# Patient Record
Sex: Female | Born: 2000 | Race: White | Hispanic: No | Marital: Single | Smoking: Current every day smoker
Health system: Southern US, Community
[De-identification: ages and names within clinical notes are randomized; demographics above are authoritative.]

## PROBLEM LIST (undated history)

## (undated) DIAGNOSIS — F419 Anxiety disorder, unspecified: Secondary | ICD-10-CM

## (undated) DIAGNOSIS — F319 Bipolar disorder, unspecified: Secondary | ICD-10-CM

## (undated) DIAGNOSIS — J45909 Unspecified asthma, uncomplicated: Secondary | ICD-10-CM

## (undated) DIAGNOSIS — F913 Oppositional defiant disorder: Secondary | ICD-10-CM

## (undated) HISTORY — PX: TONSILLECTOMY: SUR1361

---
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
## 2019-01-03 ENCOUNTER — Emergency Department (HOSPITAL_COMMUNITY)
Admission: EM | Admit: 2019-01-03 | Discharge: 2019-01-03 | Disposition: A | Discharge status: Home / Self Care | Payer: Medicaid Other | Attending: Pediatrics | Admitting: Pediatrics

## 2019-01-03 ENCOUNTER — Encounter (HOSPITAL_COMMUNITY): Payer: Self-pay | Admitting: *Deleted

## 2019-01-03 DIAGNOSIS — J45909 Unspecified asthma, uncomplicated: Secondary | ICD-10-CM | POA: Diagnosis not present

## 2019-01-03 DIAGNOSIS — F17200 Nicotine dependence, unspecified, uncomplicated: Secondary | ICD-10-CM | POA: Diagnosis not present

## 2019-01-03 DIAGNOSIS — Z79899 Other long term (current) drug therapy: Secondary | ICD-10-CM | POA: Insufficient documentation

## 2019-01-03 DIAGNOSIS — F10220 Alcohol dependence with intoxication, uncomplicated: Secondary | ICD-10-CM | POA: Diagnosis present

## 2019-01-03 DIAGNOSIS — F10920 Alcohol use, unspecified with intoxication, uncomplicated: Secondary | ICD-10-CM

## 2019-01-03 LAB — CBC WITH DIFFERENTIAL/PLATELET
Abs Immature Granulocytes: 0.01 10*3/uL (ref 0.00–0.07)
BASOS PCT: 0 %
Basophils Absolute: 0 10*3/uL (ref 0.0–0.1)
EOS PCT: 0 %
Eosinophils Absolute: 0 10*3/uL (ref 0.0–0.5)
HCT: 39.9 % (ref 36.0–46.0)
HEMOGLOBIN: 13 g/dL (ref 12.0–15.0)
Immature Granulocytes: 0 %
Lymphocytes Relative: 29 %
Lymphs Abs: 1.7 10*3/uL (ref 0.7–4.0)
MCH: 26.4 pg (ref 26.0–34.0)
MCHC: 32.6 g/dL (ref 30.0–36.0)
MCV: 80.9 fL (ref 80.0–100.0)
MONO ABS: 0.4 10*3/uL (ref 0.1–1.0)
MONOS PCT: 7 %
Neutro Abs: 3.8 10*3/uL (ref 1.7–7.7)
Neutrophils Relative %: 64 %
PLATELETS: 293 10*3/uL (ref 150–400)
RBC: 4.93 MIL/uL (ref 3.87–5.11)
RDW: 16.5 % — ABNORMAL HIGH (ref 11.5–15.5)
WBC: 6 10*3/uL (ref 4.0–10.5)
nRBC: 0 % (ref 0.0–0.2)

## 2019-01-03 LAB — RAPID URINE DRUG SCREEN, HOSP PERFORMED
AMPHETAMINES: NOT DETECTED
Barbiturates: NOT DETECTED
Benzodiazepines: NOT DETECTED
COCAINE: NOT DETECTED
OPIATES: NOT DETECTED
TETRAHYDROCANNABINOL: NOT DETECTED

## 2019-01-03 LAB — COMPREHENSIVE METABOLIC PANEL
ALK PHOS: 61 U/L (ref 38–126)
ALT: 17 U/L (ref 0–44)
ANION GAP: 16 — AB (ref 5–15)
AST: 28 U/L (ref 15–41)
Albumin: 4.7 g/dL (ref 3.5–5.0)
BUN: 10 mg/dL (ref 6–20)
CALCIUM: 9.2 mg/dL (ref 8.9–10.3)
CO2: 16 mmol/L — AB (ref 22–32)
CREATININE: 0.63 mg/dL (ref 0.44–1.00)
Chloride: 108 mmol/L (ref 98–111)
Glucose, Bld: 71 mg/dL (ref 70–99)
Potassium: 3.5 mmol/L (ref 3.5–5.1)
SODIUM: 140 mmol/L (ref 135–145)
TOTAL PROTEIN: 7.9 g/dL (ref 6.5–8.1)
Total Bilirubin: <0.1 mg/dL — ABNORMAL LOW (ref 0.3–1.2)

## 2019-01-03 LAB — PREGNANCY, URINE: PREG TEST UR: NEGATIVE

## 2019-01-03 LAB — ACETAMINOPHEN LEVEL: Acetaminophen (Tylenol), Serum: <10 ug/mL — ABNORMAL LOW (ref 10–30)

## 2019-01-03 LAB — SALICYLATE LEVEL: Salicylate Lvl: <7 mg/dL (ref 2.8–30.0)

## 2019-01-03 LAB — ETHANOL: ALCOHOL ETHYL (B): 153 mg/dL — AB (ref <10)

## 2019-01-03 MED ORDER — SODIUM CHLORIDE 0.9 % IV BOLUS
1000.0000 mL | Freq: Once | INTRAVENOUS | Status: AC
  Administered 2019-01-03: 1000 mL via INTRAVENOUS

## 2019-01-03 MED ORDER — ONDANSETRON HCL 4 MG/2ML IJ SOLN
4.0000 mg | Freq: Once | INTRAMUSCULAR | Status: AC
  Administered 2019-01-03: 4 mg via INTRAVENOUS
  Filled 2019-01-03: qty 2

## 2019-01-03 MED ORDER — DEXTROSE-NACL 5-0.9 % IV SOLN
INTRAVENOUS | Status: DC
  Administered 2019-01-03: 10:00:00 via INTRAVENOUS

## 2019-01-03 NOTE — ED Notes (Signed)
ED Provider at bedside.  Kelly H notified of B/P for second time, into arouse patient, B/P improved, and orders received.

## 2019-01-03 NOTE — ED Notes (Signed)
Pt ambulated approx 100 feet without difficulty. RN at side when ambulating. Pt returned to bed and given water and gold fish crackers. Mom remains at bedside.

## 2019-01-03 NOTE — ED Notes (Signed)
Registration in room. Pt able to be aroused but sleepy, answering questions in yes or no manner.

## 2019-01-03 NOTE — ED Triage Notes (Signed)
Pt brought in by [facility]. Per EMS pt told parents she took "1 or 2 too many" of her pills. EMS has Cipro and Benzonatate at bedside. Pt sts she had "a whole lot" of shots prior to coming home. Emesis en route. Pt sleepy, easily woken, answering questions appropriately.

## 2019-01-03 NOTE — ED Provider Notes (Signed)
[facility] EMERGENCY DEPARTMENT Provider Note   CSN: [id number] Arrival date & time: 01/03/19  0522     History   Chief Complaint Chief Complaint  Patient presents with  . Alcohol Intoxication    HPI Chelsea Andrade is a 18 y.o. female.  18 year old female with history of bipolar disorder, ODD, asthma presents to the emergency department via EMS for intoxication.  She reports drinking an undisclosed amount of liquor tonight.  Had one episode of vomiting prior to EMS arrival as well as one episode of emesis with EMS.  Father called EMS after patient reported that she had taken an additional dose of her Tessalon and ciprofloxacin.  These were prescribed to the patient over a month ago.  She has no complaints of pain at this time.  Does note some mild nausea.  No additional complaints.  The history is provided by the patient. No language interpreter was used.    Past Medical History:  Diagnosis Date  . Anxiety   . Asthma   . Bipolar disorder (HCC)   . ODD (oppositional defiant disorder)     Patient Active Problem List   Diagnosis Date Noted  . Generalized anxiety disorder 06/01/2015  . Attention deficit hyperactivity disorder (ADHD), combined type, moderate 06/01/2015  . 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 Drug overdose 05/30/2015  . Ingestion of unknown medication 05/30/2015  . Drug ingestion 05/30/2015  . ASTHMA 07/31/2011  . CONTUSION OF FOOT 07/31/2011    Past Surgical History:  Procedure Laterality Date  . TONSILLECTOMY       OB History   No obstetric history on file.      Home Medications    Prior to Admission medications   Medication Sig Start Date End Date Taking? Authorizing Provider  estrogens, conjugated, (PREMARIN) 1.25 MG tablet Take 1 tablet (1.25 mg total) by mouth daily. 01/27/18   Vena Austria, MD  ibuprofen (ADVIL,MOTRIN) 600 MG tablet Take 1 tablet (600 mg total)  by mouth every 6 (six) hours as needed for moderate pain. 08/09/18   [name], PA-C  methylphenidate 36 MG PO CR tablet Take 1 tablet (36 mg total) by mouth daily. 06/09/15   Patrick North, MD  mirtazapine (REMERON) 7.5 MG tablet Take 1 tablet (7.5 mg total) by mouth at bedtime. 06/09/15   Patrick North, MD    Family History Family History  Problem Relation Age of Onset  . Anxiety disorder Mother   . Bipolar disorder Father   . Depression Maternal Grandmother   . Anxiety disorder Maternal Grandmother   . Multiple sclerosis Maternal Grandmother     Social History Social History   Tobacco Use  . Smoking status: Current Every Day Smoker  . Smokeless tobacco: Never Used  Substance Use Topics  . Alcohol use: Yes  . Drug use: Yes    Types: Marijuana     Allergies   Patient has no known allergies.   Review of Systems Review of Systems Ten systems reviewed and are negative for acute change, except as noted in the HPI.    Physical Exam Updated Vital Signs BP (!) 76/42   Pulse 83   Temp 97.7 F (36.5 C)   Resp 16   LMP  (Approximate)   SpO2 100%   Physical Exam Vitals signs and nursing note reviewed.  Constitutional:      General: She is not in acute distress.    Appearance: She is well-developed. She is  not diaphoretic.     Comments: Nontoxic appearing and in NAD  HENT:     Head: Normocephalic and atraumatic.  Eyes:     General: No scleral icterus.    Conjunctiva/sclera: Conjunctivae normal.  Neck:     Musculoskeletal: Normal range of motion.  Cardiovascular:     Rate and Rhythm: Regular rhythm. Tachycardia present.  Pulmonary:     Effort: Pulmonary effort is normal. No respiratory distress.     Breath sounds: No stridor. No wheezing or rales.     Comments: Lungs CTAB Musculoskeletal: Normal range of motion.  Skin:    General: Skin is warm and dry.     Coloration: Skin is not pale.     Findings: No erythema or rash.     Comments: Abrasion over  left ASIS  Neurological:     Mental Status: She is alert and oriented to person, place, and time.     Comments: GCS 15.  Speech is goal oriented.  Answers questions appropriately and follows commands.  Moving all extremities spontaneously.  Will fall back to sleep when not stimulated.  Psychiatric:        Behavior: Behavior normal.      ED Treatments / Results  Labs (all labs ordered are listed, but only abnormal results are displayed) Labs Reviewed - No data to display  EKG None  Radiology No results found.  Procedures Procedures (including critical care time)  Medications Ordered in ED Medications  sodium chloride 0.9 % bolus 1,000 mL (has no administration in time range)  ondansetron (ZOFRAN) injection 4 mg (4 mg Intravenous Given 01/03/19 0600)  sodium chloride 0.9 % bolus 1,000 mL (0 mLs Intravenous Stopped 01/03/19 0703)    6:51 AM Notified by RN of patient's low blood pressure.  She is more sleepy compared to arrival, but able to be aroused with sternal rubbing.  Her blood pressure improved when more awake, but easily falls back to sleep when not stimulated.  We will continue to metabolize and order second fluid bolus.   Initial Impression / Assessment and Plan / ED Course  I have reviewed the triage vital signs and the nursing notes.  Pertinent labs & imaging results that were available during my care of the patient were reviewed by me and considered in my medical decision making (see chart for details).      Patient presents to the emergency department for alcohol intoxication.  Reports having multiple liquor drinks prior to arrival.  Father became concerned when patient notified him that she accidentally took additional doses of her Tessalon and ciprofloxacin.  These prescriptions were filled in mid December.  Noted to be alert and answering questions appropriately on arrival.  She will be given 2 L IV fluids while monitored in the department to metabolize  alcohol.  Patient remains alert to sternal rubbing.  Hypotension noted while sleeping, though this improves when more awake.  No compensatory tachycardia.  Anticipate discharge when more awake and alert.    Patient signed out to Lowanda Foster, NP at change of shift who will assume care and disposition appropriately.   Final Clinical Impressions(s) / ED Diagnoses   Final diagnoses:  Alcoholic intoxication without complication [facility])    ED Discharge Orders    None       [name], PA-C 01/03/19 0710    Nira Conn, MD 01/03/19 [phone]
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
## 2019-05-29 ENCOUNTER — Other Ambulatory Visit: Payer: Self-pay

## 2019-05-29 DIAGNOSIS — N760 Acute vaginitis: Secondary | ICD-10-CM | POA: Diagnosis not present

## 2019-05-29 DIAGNOSIS — F17200 Nicotine dependence, unspecified, uncomplicated: Secondary | ICD-10-CM | POA: Insufficient documentation

## 2019-05-29 DIAGNOSIS — N3000 Acute cystitis without hematuria: Secondary | ICD-10-CM | POA: Diagnosis not present

## 2019-05-29 DIAGNOSIS — N898 Other specified noninflammatory disorders of vagina: Secondary | ICD-10-CM | POA: Diagnosis present

## 2019-05-29 DIAGNOSIS — J45909 Unspecified asthma, uncomplicated: Secondary | ICD-10-CM | POA: Diagnosis not present

## 2019-05-29 DIAGNOSIS — B9689 Other specified bacterial agents as the cause of diseases classified elsewhere: Secondary | ICD-10-CM | POA: Insufficient documentation

## 2019-05-29 DIAGNOSIS — N766 Ulceration of vulva: Secondary | ICD-10-CM | POA: Diagnosis not present

## 2019-05-29 DIAGNOSIS — Z79899 Other long term (current) drug therapy: Secondary | ICD-10-CM | POA: Diagnosis not present

## 2019-05-29 NOTE — ED Triage Notes (Signed)
Pt states burning to inner vaginal lips with possible "bumps". Pt states began yesterday. Pt does not appear in any acute distress. Pt denies vaginal discharge.

## 2019-05-30 ENCOUNTER — Emergency Department
Admission: EM | Admit: 2019-05-30 | Discharge: 2019-05-30 | Disposition: A | Discharge status: Home / Self Care | Payer: Medicaid Other | Attending: Emergency Medicine | Admitting: Emergency Medicine

## 2019-05-30 DIAGNOSIS — B9689 Other specified bacterial agents as the cause of diseases classified elsewhere: Secondary | ICD-10-CM

## 2019-05-30 DIAGNOSIS — N766 Ulceration of vulva: Secondary | ICD-10-CM

## 2019-05-30 DIAGNOSIS — N3000 Acute cystitis without hematuria: Secondary | ICD-10-CM

## 2019-05-30 LAB — URINALYSIS, ROUTINE W REFLEX MICROSCOPIC
Bilirubin Urine: NEGATIVE
Glucose, UA: NEGATIVE mg/dL
Hgb urine dipstick: NEGATIVE
Ketones, ur: NEGATIVE mg/dL
Nitrite: NEGATIVE
Protein, ur: 30 mg/dL — AB
Specific Gravity, Urine: 1.029 (ref 1.005–1.030)
WBC, UA: >50 WBC/hpf — ABNORMAL HIGH (ref 0–5)
pH: 6 (ref 5.0–8.0)

## 2019-05-30 LAB — CHLAMYDIA/NGC RT PCR (ARMC ONLY)
Chlamydia Tr: NOT DETECTED
N gonorrhoeae: NOT DETECTED

## 2019-05-30 LAB — WET PREP, GENITAL
Sperm: NONE SEEN
Trich, Wet Prep: NONE SEEN
Yeast Wet Prep HPF POC: NONE SEEN

## 2019-05-30 LAB — POCT PREGNANCY, URINE: Preg Test, Ur: NEGATIVE

## 2019-05-30 MED ORDER — METRONIDAZOLE 500 MG PO TABS
500.0000 mg | ORAL_TABLET | Freq: Once | ORAL | Status: AC
  Administered 2019-05-30: 500 mg via ORAL
  Filled 2019-05-30: qty 1

## 2019-05-30 MED ORDER — VALACYCLOVIR HCL 500 MG PO TABS
1000.0000 mg | ORAL_TABLET | ORAL | Status: AC
  Administered 2019-05-30: 1000 mg via ORAL
  Filled 2019-05-30: qty 2

## 2019-05-30 MED ORDER — METRONIDAZOLE 500 MG PO TABS: 500.0000 mg | ORAL_TABLET | Freq: Two times a day (BID) | ORAL | 0 refills | Status: DC

## 2019-05-30 MED ORDER — LIDOCAINE-PRILOCAINE 2.5-2.5 % EX CREA
TOPICAL_CREAM | CUTANEOUS | Status: AC
  Administered 2019-05-30: 04:00:00 via TOPICAL
  Filled 2019-05-30: qty 5

## 2019-05-30 MED ORDER — CEPHALEXIN 500 MG PO CAPS
500.0000 mg | ORAL_CAPSULE | Freq: Once | ORAL | Status: AC
  Administered 2019-05-30: 500 mg via ORAL
  Filled 2019-05-30: qty 1

## 2019-05-30 MED ORDER — ONDANSETRON 4 MG PO TBDP
4.0000 mg | ORAL_TABLET | Freq: Once | ORAL | Status: AC
  Administered 2019-05-30: 4 mg via ORAL
  Filled 2019-05-30: qty 1

## 2019-05-30 MED ORDER — CEPHALEXIN 500 MG PO CAPS: 500.0000 mg | ORAL_CAPSULE | Freq: Two times a day (BID) | ORAL | 0 refills | Status: DC

## 2019-05-30 MED ORDER — VALACYCLOVIR HCL 1 G PO TABS: 1000.0000 mg | ORAL_TABLET | Freq: Two times a day (BID) | ORAL | 0 refills | Status: AC

## 2019-05-30 MED ORDER — OXYCODONE-ACETAMINOPHEN 5-325 MG PO TABS
2.0000 | ORAL_TABLET | Freq: Once | ORAL | Status: AC
  Administered 2019-05-30: 2 via ORAL
  Filled 2019-05-30: qty 2

## 2019-05-30 NOTE — Discharge Instructions (Signed)
As we discussed, you have a urinary tract infection for which I have prescribed an antibiotic called cephalexin (Keflex).  Please take the full course of treatment provided.  Additionally you have an infection that is not sexually transmitted and is very common for women, called bacterial vaginosis.  For this you need to complete the course of metronidazole (Flagyl) as prescribed.  Additionally, we recommend that you take the prescribed valacyclovir (Valtrex) until you can follow-up with Dr. Georgianne Fick on Monday to discuss the ulcers and lesions that are causing so much pain.  I also wrote a prescription for nausea medicine in case all of the other medications caused your stomach to be upset.  Please try to drink plenty of fluids and you may want to have some food on your stomach when you take the medications.  Call the office of Dr. Georgianne Fick on Monday to schedule a same-day appointment if at all possible.  Return to the emergency department if you develop new or worsening symptoms that concern you.

## 2019-05-30 NOTE — ED Provider Notes (Signed)
[facility] Emergency Department Provider Note  ____________________________________________   First MD Initiated Contact with Patient 05/30/19 0124     (approximate)  I have reviewed the triage vital signs and the nursing notes.   HISTORY  Chief Complaint vaginal burning    HPI Chelsea Andrade is a 18 y.o. female with medical history as listed below who presents for evaluation of painful lesions in her genital area and burning with urination.  She said that they have developed over the last couple of days and she can feel some bumps that are painful to the touch but not exquisitely painful.  She has burning pain whenever she urinates.  She has been with the same sexual partner for the last 3 years and that is the only sexual partner she has had.  She denies fever/chills, sore throat, cough, chest pain, shortness of breath, nausea, vomiting, and abdominal pain or pelvic pain.  All the pain is when she urinates and in her vulvar area.  She has no history of UTIs nor STDs.         Past Medical History:  Diagnosis Date   Anxiety    Asthma    Bipolar disorder ([place])    ODD (oppositional defiant disorder)     Patient Active Problem List   Diagnosis Date Noted   Generalized anxiety disorder 06/01/2015   Attention deficit hyperactivity disorder (ADHD), combined type, moderate 06/01/2015   MDD (major depressive disorder), recurrent severe, without psychosis ([place]) 05/31/2015   Drug overdose 05/30/2015   Ingestion of unknown medication 05/30/2015   Drug ingestion 05/30/2015   ASTHMA 07/31/2011   CONTUSION OF FOOT 07/31/2011    Past Surgical History:  Procedure Laterality Date   TONSILLECTOMY      Prior to Admission medications   Medication Sig Start Date End Date Taking? Authorizing Provider  cephALEXin (KEFLEX) 500 MG capsule Take 1 capsule (500 mg total) by mouth 2 (two) times daily. 05/30/19   Hinda Kehr, MD  estrogens,  conjugated, (PREMARIN) 1.25 MG tablet Take 1 tablet (1.25 mg total) by mouth daily. 01/27/18   Malachy Mood, MD  ibuprofen (ADVIL,MOTRIN) 600 MG tablet Take 1 tablet (600 mg total) by mouth every 6 (six) hours as needed for moderate pain. 08/09/18   Duanne Guess, PA-C  methylphenidate 36 MG PO CR tablet Take 1 tablet (36 mg total) by mouth daily. 06/09/15   Elvin So, MD  metroNIDAZOLE (FLAGYL) 500 MG tablet Take 1 tablet (500 mg total) by mouth 2 (two) times daily. 05/30/19   Hinda Kehr, MD  mirtazapine (REMERON) 7.5 MG tablet Take 1 tablet (7.5 mg total) by mouth at bedtime. 06/09/15   Elvin So, MD  valACYclovir (VALTREX) 1000 MG tablet Take 1 tablet (1,000 mg total) by mouth 2 (two) times daily for 10 days. 05/30/19 06/09/19  Hinda Kehr, MD    Allergies Patient has no known allergies.  Family History  Problem Relation Age of Onset   Anxiety disorder Mother    Bipolar disorder Father    Depression Maternal Grandmother    Anxiety disorder Maternal Grandmother    Multiple sclerosis Maternal Grandmother     Social History Social History   Tobacco Use   Smoking status: Current Every Day Smoker   Smokeless tobacco: Never Used  Substance Use Topics   Alcohol use: Yes   Drug use: Yes    Types: Marijuana    Review of Systems Constitutional: No fever/chills Eyes: No visual changes. ENT: No sore throat.  Cardiovascular: Denies chest pain. Respiratory: Denies shortness of breath. Gastrointestinal: No abdominal pain.  No nausea, no vomiting.  No diarrhea.  No constipation. Genitourinary: Negative for dysuria. Musculoskeletal: Negative for neck pain.  Negative for back pain. Integumentary: Negative for rash. Neurological: Negative for headaches, focal weakness or numbness.   ____________________________________________   PHYSICAL EXAM:  VITAL SIGNS: ED Triage Vitals  Enc Vitals Group     BP 05/29/19 2139 122/75     Pulse Rate 05/29/19 2139 (!)  110     Resp 05/29/19 2139 18     Temp 05/29/19 2139 99.1 F (37.3 C)     Temp Source 05/29/19 2139 Oral     SpO2 05/29/19 2139 100 %     Weight 05/29/19 2140 56.7 kg (125 lb)     Height 05/29/19 2140 1.676 m (5\' 6" )     Head Circumference --      Peak Flow --      Pain Score 05/29/19 2140 8     Pain Loc --      Pain Edu? --      Excl. in GC? --     Constitutional: Alert and oriented. Well appearing and in no acute distress but appears uncomfortable when she moves. Eyes: Conjunctivae are normal.  Head: Atraumatic. Respiratory: Normal respiratory effort.  No retractions. No audible wheezing. Gastrointestinal: Soft and nontender. No distention.  Genitourinary: The patient has numerous roughly circular white raised lesions all throughout her vulva, some of which look vesicular but do not appear in clusters like the classic appearance of HSV-2.  She has excoriation of the skin with skin breakdown particularly just outside of the labia majora near the inferior part of the labia.  The lesions are very tender to the touch.  She has a copious amount of whitish discharge present in the vaginal vault.  IUD strings are visible.  Cervix is normal in appearance without evidence of cervicitis or friable tissue.  Due to the external lesions, she was unable to tolerate bimanual exam.  Speculum exam was very painful for her.  ED chaperone present throughout exam. Musculoskeletal: No lower extremity tenderness nor edema. No gross deformities of extremities. Neurologic:  Normal speech and language. No gross focal neurologic deficits are appreciated.  Skin:  Skin is warm, dry and intact. No rash noted. Psychiatric: Mood and affect are normal. Speech and behavior are normal.  ____________________________________________   LABS (all labs ordered are listed, but only abnormal results are displayed)  Labs Reviewed  WET PREP, GENITAL - Abnormal; Notable for the following components:      Result Value    Clue Cells Wet Prep HPF POC PRESENT (*)    WBC, Wet Prep HPF POC MANY (*)    All other components within normal limits  URINALYSIS, ROUTINE W REFLEX MICROSCOPIC - Abnormal; Notable for the following components:   Color, Urine YELLOW (*)    APPearance HAZY (*)    Protein, ur 30 (*)    Leukocytes,Ua LARGE (*)    WBC, UA >50 (*)    Bacteria, UA RARE (*)    All other components within normal limits  CHLAMYDIA/NGC RT PCR ([facility] ONLY)  URINE CULTURE  POCT PREGNANCY, URINE  POC URINE PREG, ED   ____________________________________________  EKG  None - EKG not ordered by ED physician ____________________________________________  RADIOLOGY   ED MD interpretation: No indication for imaging  Official radiology report(s): No results found.  ____________________________________________   PROCEDURES   Procedure(s) performed (including Critical  Care):  Procedures   ____________________________________________   INITIAL IMPRESSION / MDM / ASSESSMENT AND PLAN / ED COURSE  As part of my medical decision making, I reviewed the following data within the electronic MEDICAL RECORD NUMBER Nursing notes reviewed and incorporated, Labs reviewed , Discussed with Dr. Bonney AidStaebler by phone, and reviewed Notes from prior ED visits      *Raylei Defino was evaluated in Emergency Department on 05/30/2019 for the symptoms described in the history of present illness. She was evaluated in the context of the global COVID-19 pandemic, which necessitated consideration that the patient might be at risk for infection with the SARS-CoV-2 virus that causes COVID-19. Institutional protocols and algorithms that pertain to the evaluation of patients at risk for COVID-19 are in a state of rapid change based on information released by regulatory bodies including the CDC and federal and state organizations. These policies and algorithms were followed during the patient's care in the ED.  Some ED evaluations  and interventions may be delayed as a result of limited staffing during the pandemic.*  Differential diagnosis includes, but is not limited to, genital herpes, aphthous ulcers, BV, UTI, PID/TOA, trichomoniasis, yeast infection.  The patient initially had some tachycardic upon arrival but that is resolved and she has no systemic issues at all including no abdominal nor pelvic pain.  The issues seem to be primarily external.  Her urine is strongly positive and I have sent a culture and will treat empirically with Keflex.  I have sent off a wet prep as well as GC chlamydia.  I am most suspicious for genital herpes but the appearance is not completely consistent and could represent aphthous ulcers.  I called and spoke by phone with Dr. Bonney AidStaebler who is her OB/GYN.  He recommended the use of EMLA cream for pain control, treating empirically for primary herpes outbreak with Valtrex, and he will see her in clinic within a couple of days.  I have dated the patient.  She prefers to stay in the ED for the results of the gonorrhea and chlamydia test which I think is understandable and appropriate.  There is no evidence that she has PID given no abdominal pain or pelvic pain but if she is positive for gonorrhea or chlamydia I may treat with doxycycline for 2 weeks rather than a single dose of azithromycin.  I will reassess once the results are back.  Clinical Course as of May 30 527  Sat May 30, 2019  0337 BV, but no trichomonas nor yeast  Wet prep, genital(!) [CF]  [phone] Still awaiting chlamydia/gonorrhea results.  I have treated thus far with Keflex 500 mg by mouth, metronidazole 500 mg by mouth, EMLA cream, Valtrex 1 g p.o., Zofran 4 mg p.o. ODT anticipating some stomach discomfort with all the other medications.   [CF]  0520 The patient's GC chlamydia test came back negative.  We will proceed with discharge and she will follow-up with Dr. Bonney AidStaebler on Monday.  I reiterated my management recommendations and return  precautions and she understands and agrees with the plan.   [CF]    Clinical Course User Index [CF] Loleta RoseForbach, Caisley Baxendale, MD     ____________________________________________  FINAL CLINICAL IMPRESSION(S) / ED DIAGNOSES  Final diagnoses:  Ulcer of genital labia  Acute cystitis without hematuria  Bacterial vaginosis     MEDICATIONS GIVEN DURING THIS VISIT:  Medications  oxyCODONE-acetaminophen (PERCOCET/ROXICET) 5-325 MG per tablet 2 tablet (2 tablets Oral Given 05/30/19 0157)  lidocaine-prilocaine (EMLA) cream ( Topical  Given 05/30/19 0343)  cephALEXin (KEFLEX) capsule 500 mg (500 mg Oral Given 05/30/19 0344)  valACYclovir (VALTREX) tablet 1,000 mg (1,000 mg Oral Given 05/30/19 0343)  metroNIDAZOLE (FLAGYL) tablet 500 mg (500 mg Oral Given 05/30/19 0343)  ondansetron (ZOFRAN-ODT) disintegrating tablet 4 mg (4 mg Oral Given 05/30/19 0343)     ED Discharge Orders         Ordered    cephALEXin (KEFLEX) 500 MG capsule  2 times daily     05/30/19 0525    metroNIDAZOLE (FLAGYL) 500 MG tablet  2 times daily     05/30/19 0525    valACYclovir (VALTREX) 1000 MG tablet  2 times daily     05/30/19 [id number]           Note:  This document was prepared using Dragon voice recognition software and may include unintentional dictation errors.   Loleta RoseForbach, Merisa Julio, MD 05/30/19 [phone]

## 2019-05-30 NOTE — ED Notes (Signed)
ED Provider at bedside. 

## 2019-05-31 LAB — URINE CULTURE
Culture: <10000 — AB
Special Requests: NORMAL

## 2019-06-03 ENCOUNTER — Other Ambulatory Visit: Payer: Self-pay

## 2019-06-03 ENCOUNTER — Encounter: Payer: Self-pay | Admitting: Obstetrics & Gynecology

## 2019-06-03 ENCOUNTER — Ambulatory Visit (INDEPENDENT_AMBULATORY_CARE_PROVIDER_SITE_OTHER): Payer: Medicaid Other | Admitting: Obstetrics & Gynecology

## 2019-06-03 VITALS — BP 88/60 | Ht 65.0 in | Wt 124.0 lb

## 2019-06-03 DIAGNOSIS — A6004 Herpesviral vulvovaginitis: Secondary | ICD-10-CM | POA: Diagnosis not present

## 2019-06-03 DIAGNOSIS — R102 Pelvic and perineal pain: Secondary | ICD-10-CM

## 2019-06-03 MED ORDER — LIDOCAINE-PRILOCAINE 2.5-2.5 % EX CREA: 1.0000 "application " | TOPICAL_CREAM | CUTANEOUS | 0 refills | Status: DC | PRN

## 2019-06-03 NOTE — Progress Notes (Signed)
HPI:      Chelsea Andrade is a 18 y.o. No obstetric history on file., No LMP recorded. (Menstrual status: IUD)., presents today for a problem visit.  She complains of:  Vulvar concern:   This is a 18 y.o. old Caucasian/White female who presents for the evaluation of vulvar lesion(s). She describes the vulvar lesion(s) as very painful, around vulva, have started to fade but still has the pain.  She indicates that she has noticed 5 or so lesions small and tender to touch.  She indicates she first noticed the problem one week ago. She admits to symptoms of pain.  The following aggravating factors are identified: sexual intercourse, physical activity, wearing pads, wearing tight clothing and urination. The following alleviating factors are identified: topical Lidocaine.  She was seen in ER and had UTI dx, also suspicion for HSV and started on Valtrex, no testing done; neg GC/Chl testing.  PMHx: She  has a past medical history of Anxiety, Asthma, Bipolar disorder (HCC), and ODD (oppositional defiant disorder). Also,  has a past surgical history that includes Tonsillectomy., family history includes Anxiety disorder in her maternal grandmother and mother; Bipolar disorder in her father; Depression in her maternal grandmother; Multiple sclerosis in her maternal grandmother.,  reports that she has been smoking. She has never used smokeless tobacco. She reports current alcohol use. She reports current drug use. Drug: Marijuana.  She has a current medication list which includes the following prescription(s): cephalexin, estrogens (conjugated), ibuprofen, metronidazole, valacyclovir, lidocaine-prilocaine, methylphenidate, and mirtazapine. Also, has No Known Allergies.  Review of Systems  Constitutional: Negative for chills, fever and malaise/fatigue.  HENT: Negative for congestion, sinus pain and sore throat.   Eyes: Negative for blurred vision and pain.  Respiratory: Negative for cough and wheezing.    Cardiovascular: Negative for chest pain and leg swelling.  Gastrointestinal: Negative for abdominal pain, constipation, diarrhea, heartburn, nausea and vomiting.  Genitourinary: Negative for dysuria, frequency, hematuria and urgency.  Musculoskeletal: Negative for back pain, joint pain, myalgias and neck pain.  Skin: Negative for itching and rash.  Neurological: Negative for dizziness, tremors and weakness.  Endo/Heme/Allergies: Does not bruise/bleed easily.  Psychiatric/Behavioral: Negative for depression. The patient is not nervous/anxious and does not have insomnia.     Objective: BP (!) 88/60   Ht 5\' 5"  (1.651 m)   Wt 124 lb (56.2 kg)   BMI 20.63 kg/m  Physical Exam Constitutional:      General: She is not in acute distress.    Appearance: She is well-developed.  Genitourinary:     Pelvic exam was performed with patient supine.     Urethra and bladder normal.     Vulval lesion and tenderness present.     Vaginal bleeding present.     No vaginal erythema.     No cervical motion tenderness, discharge, polyp or nabothian cyst.     Genitourinary Comments: Unable to do speculum exam due to pt pain and refusal  HENT:     Head: Normocephalic and atraumatic.     Nose: Nose normal.  Abdominal:     General: There is no distension.     Palpations: Abdomen is soft.     Tenderness: There is no abdominal tenderness.  Musculoskeletal: Normal range of motion.  Neurological:     Mental Status: She is alert and oriented to person, place, and time.     Cranial Nerves: No cranial nerve deficit.  Skin:    General: Skin is warm and dry.  ASSESSMENT/PLAN:    Problem List Items Addressed This Visit    Herpes simplex vulvovaginitis    -  Primary   Relevant Orders   HSV(herpes smplx)abs-1+2(IgG+IgM)-bld   HEP, RPR, HIV Panel   Vulvar pain       Emla cream to help w pain Cont Valtrex until dx confirmed Discussed suppressive tx option Source and concerns w partner discussed            Barnett Applebaum, MD, [facility], [facility] 06/03/2019  2:20 PM

## 2019-06-05 LAB — HEP, RPR, HIV PANEL
HIV Screen 4th Generation wRfx: NONREACTIVE
Hepatitis B Surface Ag: NEGATIVE
RPR Ser Ql: NONREACTIVE

## 2019-06-05 LAB — HSV(HERPES SMPLX)ABS-I+II(IGG+IGM)-BLD
HSV 1 Glycoprotein G Ab, IgG: 2.02 index — ABNORMAL HIGH (ref 0.00–0.90)
HSV 2 IgG, Type Spec: <0.91 index (ref 0.00–0.90)
HSVI/II Comb IgM: 2.45 Ratio — ABNORMAL HIGH (ref 0.00–0.90)

## 2019-06-08 ENCOUNTER — Encounter: Payer: Self-pay | Admitting: Obstetrics & Gynecology

## 2019-06-08 NOTE — Progress Notes (Signed)
Phone number does not work. Letter created/sent to have pt call back for results.

## 2019-06-10 ENCOUNTER — Telehealth: Payer: Self-pay

## 2019-06-10 ENCOUNTER — Other Ambulatory Visit: Payer: Self-pay | Admitting: Obstetrics and Gynecology

## 2019-06-10 MED ORDER — LIDOCAINE-PRILOCAINE 2.5-2.5 % EX CREA: 1.0000 "application " | TOPICAL_CREAM | CUTANEOUS | 0 refills | Status: DC | PRN

## 2019-06-10 NOTE — Progress Notes (Signed)
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 Had neg HSV 2 IgG. Lesion Sx are improving. Finishing valtrex, needs RF on emla crm since helping with pain. Current partner with hx of oral HSV. No need for suppressive tx since HSV 1 in genital area. F/u prn.

## 2019-06-10 NOTE — Telephone Encounter (Signed)
Done. See orders note 

## 2019-06-10 NOTE — Telephone Encounter (Signed)
Pt calling for results of blood work; also to discuss medication rx PH was going to give her.  [phone]

## 2019-07-16 ENCOUNTER — Telehealth: Payer: Self-pay | Admitting: Obstetrics and Gynecology

## 2019-07-16 NOTE — Telephone Encounter (Signed)
Patient called and left message with after hour nurse line for an appointment to schedule for IUD. I called and left message for patient to call back to be schedule

## 2019-08-04 ENCOUNTER — Other Ambulatory Visit: Payer: Self-pay

## 2019-08-04 DIAGNOSIS — Z20822 Contact with and (suspected) exposure to covid-19: Secondary | ICD-10-CM

## 2019-08-05 LAB — NOVEL CORONAVIRUS, NAA: SARS-CoV-2, NAA: NOT DETECTED

## 2019-08-06 ENCOUNTER — Ambulatory Visit (INDEPENDENT_AMBULATORY_CARE_PROVIDER_SITE_OTHER): Payer: Medicaid Other | Admitting: Obstetrics and Gynecology

## 2019-08-06 ENCOUNTER — Encounter: Payer: Self-pay | Admitting: Obstetrics and Gynecology

## 2019-08-06 ENCOUNTER — Other Ambulatory Visit: Payer: Self-pay

## 2019-08-06 VITALS — BP 104/58 | Ht 65.0 in | Wt 121.0 lb

## 2019-08-06 DIAGNOSIS — Z3009 Encounter for other general counseling and advice on contraception: Secondary | ICD-10-CM

## 2019-08-06 DIAGNOSIS — Z30432 Encounter for removal of intrauterine contraceptive device: Secondary | ICD-10-CM

## 2019-08-06 NOTE — Progress Notes (Signed)
   GYNECOLOGY OFFICE PROCEDURE NOTE  Chelsea Andrade is a 18 y.o. No obstetric history on file. here for [facility] IUD removal placed 2018. She desires removal secondary to irregular bleeding..  IUD Removal  Patient identified, informed consent performed, consent signed.  Patient was in the dorsal lithotomy position, normal external genitalia was noted.  A speculum was placed in the patient's vagina, normal discharge was noted, no lesions. The cervix was visualized, no lesions, no abnormal discharge.  The strings of the IUD were grasped and pulled using ring forceps. The IUD was removed in its entirety. Patient tolerated the procedure well.    Patient will use barrier contraception for next few months.  May be interested in OCP eventually.  Routine preventative health maintenance measures emphasized.   Malachy Mood, MD, [facility], [place]

## 2019-09-11 ENCOUNTER — Ambulatory Visit: Payer: Medicaid Other | Admitting: Obstetrics & Gynecology

## 2019-09-14 LAB — FETAL NONSTRESS TEST

## 2019-09-21 ENCOUNTER — Ambulatory Visit: Payer: Medicaid Other | Admitting: Obstetrics and Gynecology

## 2019-09-25 ENCOUNTER — Ambulatory Visit: Payer: Medicaid Other | Admitting: Obstetrics and Gynecology

## 2019-11-24 ENCOUNTER — Ambulatory Visit: Payer: Medicaid Other | Admitting: Obstetrics and Gynecology

## 2019-12-18 NOTE — L&D Delivery Note (Signed)
Delivery Note At  0002, a viable female was delivered vaginally, presenting OA, with restitution to ROT. Easy delivery of the anterior shoulder, followed by the posterior shoulder and torso. Infant was placed immediately skin to skin on maternal abdomen. Spontaneous respirations noted..  APGAR:8 9, ; weight  pending.   Placenta status:  intact at 0007,  .  Cord:  3 vessel. The cord was clamped and cut only once no longer pulsing.  .  Anesthesia:  Epidural Episiotomy:  none Lacerations:  bilateral labial lacerations and small 1st degree perineal Suture Repair: 3.0 vicryl rapide Est. Blood Loss (mL):  250 cc  Mom to postpartum.  Baby to Couplet care / Skin to Skin.  Mirna Mires 10/06/2020, 12:49 AM

## 2019-12-31 ENCOUNTER — Emergency Department
Admission: EM | Admit: 2019-12-31 | Discharge: 2019-12-31 | Disposition: A | Discharge status: Home / Self Care | Payer: Medicaid Other | Attending: Emergency Medicine | Admitting: Emergency Medicine

## 2019-12-31 ENCOUNTER — Other Ambulatory Visit: Payer: Self-pay

## 2019-12-31 DIAGNOSIS — M545 Low back pain: Secondary | ICD-10-CM | POA: Diagnosis present

## 2019-12-31 DIAGNOSIS — Z5321 Procedure and treatment not carried out due to patient leaving prior to being seen by health care provider: Secondary | ICD-10-CM | POA: Insufficient documentation

## 2019-12-31 LAB — POCT PREGNANCY, URINE: Preg Test, Ur: NEGATIVE

## 2019-12-31 LAB — CBC
HCT: 37.8 % (ref 36.0–46.0)
Hemoglobin: 12.9 g/dL (ref 12.0–15.0)
MCH: 29.3 pg (ref 26.0–34.0)
MCHC: 34.1 g/dL (ref 30.0–36.0)
MCV: 85.9 fL (ref 80.0–100.0)
Platelets: 254 10*3/uL (ref 150–400)
RBC: 4.4 MIL/uL (ref 3.87–5.11)
RDW: 11.7 % (ref 11.5–15.5)
WBC: 6.5 10*3/uL (ref 4.0–10.5)
nRBC: 0 % (ref 0.0–0.2)

## 2019-12-31 LAB — URINALYSIS, COMPLETE (UACMP) WITH MICROSCOPIC
Bacteria, UA: NONE SEEN
Bilirubin Urine: NEGATIVE
Glucose, UA: NEGATIVE mg/dL
Hgb urine dipstick: NEGATIVE
Ketones, ur: NEGATIVE mg/dL
Leukocytes,Ua: NEGATIVE
Nitrite: NEGATIVE
Protein, ur: NEGATIVE mg/dL
Specific Gravity, Urine: 1.002 — ABNORMAL LOW (ref 1.005–1.030)
WBC, UA: NONE SEEN WBC/hpf (ref 0–5)
pH: 6 (ref 5.0–8.0)

## 2019-12-31 LAB — BASIC METABOLIC PANEL
Anion gap: 10 (ref 5–15)
BUN: 12 mg/dL (ref 6–20)
CO2: 21 mmol/L — ABNORMAL LOW (ref 22–32)
Calcium: 9.5 mg/dL (ref 8.9–10.3)
Chloride: 110 mmol/L (ref 98–111)
Creatinine, Ser: 0.61 mg/dL (ref 0.44–1.00)
GFR calc Af Amer: >60 mL/min (ref >60)
GFR calc non Af Amer: >60 mL/min (ref >60)
Glucose, Bld: 94 mg/dL (ref 70–99)
Potassium: 3.7 mmol/L (ref 3.5–5.1)
Sodium: 141 mmol/L (ref 135–145)

## 2019-12-31 NOTE — ED Triage Notes (Signed)
Pt presents via POV c/o lower back pain x1 week. Pt presents because she has taken oxycodone x2 and ES Tylenol and is now drowsy. A&Ox4, answering questions appropriately.

## 2019-12-31 NOTE — ED Notes (Signed)
Patient called, no answer. Not seen in lobby, bathroom, or outside 

## 2019-12-31 NOTE — ED Notes (Signed)
Patient called, no answer.

## 2020-03-09 ENCOUNTER — Other Ambulatory Visit: Payer: Self-pay

## 2020-03-09 ENCOUNTER — Ambulatory Visit (INDEPENDENT_AMBULATORY_CARE_PROVIDER_SITE_OTHER): Payer: Medicaid Other | Admitting: Obstetrics and Gynecology

## 2020-03-09 ENCOUNTER — Other Ambulatory Visit (HOSPITAL_COMMUNITY)
Admission: RE | Admit: 2020-03-09 | Discharge: 2020-03-09 | Disposition: A | Discharge status: Home / Self Care | Payer: Medicaid Other | Source: Ambulatory Visit | Attending: Obstetrics and Gynecology | Admitting: Obstetrics and Gynecology

## 2020-03-09 ENCOUNTER — Encounter: Payer: Self-pay | Admitting: Obstetrics and Gynecology

## 2020-03-09 VITALS — BP 114/70 | Wt 120.0 lb

## 2020-03-09 DIAGNOSIS — B009 Herpesviral infection, unspecified: Secondary | ICD-10-CM | POA: Insufficient documentation

## 2020-03-09 DIAGNOSIS — Z113 Encounter for screening for infections with a predominantly sexual mode of transmission: Secondary | ICD-10-CM | POA: Insufficient documentation

## 2020-03-09 DIAGNOSIS — O98519 Other viral diseases complicating pregnancy, unspecified trimester: Secondary | ICD-10-CM

## 2020-03-09 DIAGNOSIS — Z3400 Encounter for supervision of normal first pregnancy, unspecified trimester: Secondary | ICD-10-CM | POA: Diagnosis present

## 2020-03-09 DIAGNOSIS — Z3A01 Less than 8 weeks gestation of pregnancy: Secondary | ICD-10-CM | POA: Diagnosis not present

## 2020-03-09 DIAGNOSIS — Z3689 Encounter for other specified antenatal screening: Secondary | ICD-10-CM

## 2020-03-09 NOTE — Progress Notes (Signed)
hsv   New Obstetric Patient H&P    Chief Complaint: "Desires prenatal care"   History of Present Illness: Patient is a 19 y.o. G1P0 Not Hispanic or Latino female, presents with amenorrhea and positive home pregnancy test. Patient's last menstrual period was 01/17/2020. and based on her  LMP, her EDD is Estimated Date of Delivery: 10/23/20 and her EGA is [redacted]w[redacted]d. Cycles are irregular   Her last menstrual period was normal. Since her LMP she claims she has experienced some fatigue, breast tenderness. She denies vaginal bleeding. Her past medical history is noncontributory.   Since her LMP, she admits to the use of tobacco products  no There are cats in the home in the home  No She admits close contact with children on a regular basis  no  She has had chicken pox in the past yes She has had Tuberculosis exposures, symptoms, or previously tested positive for TB   no Current or past history of domestic violence. no  Genetic Screening/Teratology Counseling: (Includes patient, baby's father, or anyone in either family with:)   1. Patient's age >/= 22 at [facility]  no 2. Thalassemia ([place], [place], Mediterranean, or Asian background): MCV<80  no 3. Neural tube defect (meningomyelocele, spina bifida, anencephaly)  no 4. Congenital heart defect  no  5. Down syndrome  no 6. Tay-Sachs (Jewish, [place]  no 7. Canavan's Disease  no 8. Sickle cell disease or trait (African)  no  9. Hemophilia or other blood disorders  no  10. Muscular dystrophy  no  11. Cystic fibrosis  no  12. Huntington's Chorea  no  13. Mental retardation/autism  no 14. Other inherited genetic or chromosomal disorder  no 15. Maternal metabolic disorder (DM, PKU, etc)  no 16. Patient or FOB with a child with a birth defect not listed above no  16a. Patient or FOB with a birth defect themselves no 17. Recurrent pregnancy loss, or stillbirth  no  18. Any medications since LMP other than prenatal vitamins (include vitamins,  supplements, OTC meds, drugs, alcohol)  no 19. Any other genetic/environmental exposure to discuss  no  Infection History:   1. Lives with someone with TB or TB exposed  no  2. Patient or partner has history of genital herpes  yes 3. Rash or viral illness since LMP  no 4. History of STI (GC, CT, HPV, syphilis, HIV)  no 5. History of recent travel :  no  Other pertinent information:  yes Unsure medical history on FOB not involved    Review of Systems:10 point review of systems negative unless otherwise noted in HPI  Past Medical History:  Patient Active Problem List   Diagnosis Date Noted  . Generalized anxiety disorder 06/01/2015  . Attention deficit hyperactivity disorder (ADHD), combined type, moderate 06/01/2015  . 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 Drug overdose 05/30/2015  . Ingestion of unknown medication 05/30/2015  . Drug ingestion 05/30/2015  . ASTHMA 07/31/2011    Qualifier: Diagnosis of  By: Thurmond Butts MD, Dennard Nip     . CONTUSION OF FOOT 07/31/2011    Qualifier: Diagnosis of  By: Thurmond Butts MD, Dennard Nip       Past Surgical History:  Past Surgical History:  Procedure Laterality Date  . TONSILLECTOMY      Gynecologic History: Patient's last menstrual period was 01/17/2020.  Obstetric History: G1P0  Family History:  Family History  Problem Relation Age of Onset  . Anxiety disorder Mother   . Bipolar  disorder Father   . Depression Maternal Grandmother   . Anxiety disorder Maternal Grandmother   . Multiple sclerosis Maternal Grandmother     Social History:  Social History   Socioeconomic History  . Marital status: Single    Spouse name: Not on file  . Number of children: Not on file  . Years of education: Not on file  . Highest education level: Not on file  Occupational History  . Not on file  Tobacco Use  . Smoking status: Current Every Day Smoker  . Smokeless tobacco: Never Used  Substance and  Sexual Activity  . Alcohol use: Not Currently  . Drug use: Not Currently    Types: Marijuana  . Sexual activity: Yes    Birth control/protection: None    Comment: pt denies being sexually active  Other Topics Concern  . Not on file  Social History Narrative   Lives with Maternal Gma Chelsea Andrade and Mother Chelsea Andrade. Step Dad 2 siblings. Cats, dogs, horses. Home schooled. Uncle died in [place] last week. Hx of bipolar, depression, anxiety. Dad not involved.   Social Determinants of Health   Financial Resource Strain:   . Difficulty of Paying Living Expenses:   Food Insecurity:   . Worried About Charity fundraiser in the Last Year:   . Arboriculturist in the Last Year:   Transportation Needs:   . Film/video editor (Medical):   Marland Kitchen Lack of Transportation (Non-Medical):   Physical Activity:   . Days of Exercise per Week:   . Minutes of Exercise per Session:   Stress:   . Feeling of Stress :   Social Connections:   . Frequency of Communication with Friends and Family:   . Frequency of Social Gatherings with Friends and Family:   . Attends Religious Services:   . Active Member of Clubs or Organizations:   . Attends Archivist Meetings:   Marland Kitchen Marital Status:   Intimate Partner Violence:   . Fear of Current or Ex-Partner:   . Emotionally Abused:   Marland Kitchen Physically Abused:   . Sexually Abused:     Allergies:  No Known Allergies  Medications: Prior to Admission medications   Not on File    Physical Exam Vitals: Blood pressure 114/70, weight 120 lb (54.4 kg), last menstrual period 01/17/2020.  General: NAD HEENT: normocephalic, anicteric Thyroid: no enlargement, no palpable nodules Pulmonary: No increased work of breathing, CTAB Cardiovascular: RRR, distal pulses 2+ Abdomen: NABS, soft, non-tender, non-distended.  Umbilicus without lesions.  No hepatomegaly, splenomegaly or masses palpable. No evidence of hernia  Genitourinary:  External: Normal external  female genitalia.  Normal urethral meatus, normal  Bartholin's and Skene's glands.    Vagina: Normal vaginal mucosa, no evidence of prolapse.    Cervix: Grossly normal in appearance, no bleeding  Uterus:  Non-enlarged, mobile, normal contour.  No CMT  Adnexa: ovaries non-enlarged, no adnexal masses  Rectal: deferred Extremities: no edema, erythema, or tenderness Neurologic: Grossly intact Psychiatric: mood appropriate, affect full   Assessment: 19 y.o. G1P0 at [redacted]w[redacted]d presenting to initiate prenatal care  Plan: 1) Avoid alcoholic beverages. 2) Patient encouraged not to smoke.  3) Discontinue the use of all non-medicinal drugs and chemicals.  4) Take prenatal vitamins daily.  5) Nutrition, food safety (fish, cheese advisories, and high nitrite foods) and exercise discussed. 6) Hospital and practice style discussed with cross coverage system.  7) Genetic Screening, such as with 1st Trimester Screening, cell free fetal  DNA, AFP testing, and Ultrasound, as well as with amniocentesis and CVS as appropriate, is discussed with patient. At the conclusion of today's visit patient requested genetic testing 8) Dating ultrasound given unsure LMP   Vena Austria, MD, [facility], [facility] 03/09/2020, 1:52 PM

## 2020-03-09 NOTE — Progress Notes (Signed)
NOB today LMP 01/17/2020

## 2020-03-10 LAB — RPR+RH+ABO+RUB AB+AB SCR+CB...
Antibody Screen: NEGATIVE
HIV Screen 4th Generation wRfx: NONREACTIVE
Hematocrit: 39 % (ref 34.0–46.6)
Hemoglobin: 13.5 g/dL (ref 11.1–15.9)
Hepatitis B Surface Ag: NEGATIVE
MCH: 31.1 pg (ref 26.6–33.0)
MCHC: 34.6 g/dL (ref 31.5–35.7)
MCV: 90 fL (ref 79–97)
Platelets: 273 10*3/uL (ref 150–450)
RBC: 4.34 x10E6/uL (ref 3.77–5.28)
RDW: 12.2 % (ref 11.7–15.4)
RPR Ser Ql: NONREACTIVE
Rh Factor: POSITIVE
Rubella Antibodies, IGG: 10.5 index (ref >0.99)
Varicella zoster IgG: 885 index (ref >165)
WBC: 6.2 10*3/uL (ref 3.4–10.8)

## 2020-03-11 ENCOUNTER — Other Ambulatory Visit: Payer: Self-pay | Admitting: Obstetrics and Gynecology

## 2020-03-11 LAB — CERVICOVAGINAL ANCILLARY ONLY
Chlamydia: NEGATIVE
Comment: NEGATIVE
Comment: NEGATIVE
Comment: NORMAL
Neisseria Gonorrhea: NEGATIVE
Trichomonas: NEGATIVE

## 2020-03-11 LAB — URINE CULTURE: Organism ID, Bacteria: NO GROWTH

## 2020-03-11 MED ORDER — VALACYCLOVIR HCL 500 MG PO TABS: 500.0000 mg | ORAL_TABLET | Freq: Two times a day (BID) | ORAL | 6 refills | Status: AC

## 2020-03-15 ENCOUNTER — Encounter: Payer: Self-pay | Admitting: Advanced Practice Midwife

## 2020-03-15 ENCOUNTER — Other Ambulatory Visit: Payer: Self-pay | Admitting: Obstetrics and Gynecology

## 2020-03-15 ENCOUNTER — Ambulatory Visit (INDEPENDENT_AMBULATORY_CARE_PROVIDER_SITE_OTHER): Payer: Medicaid Other

## 2020-03-15 ENCOUNTER — Other Ambulatory Visit: Payer: Self-pay

## 2020-03-15 ENCOUNTER — Ambulatory Visit (INDEPENDENT_AMBULATORY_CARE_PROVIDER_SITE_OTHER): Payer: Medicaid Other | Admitting: Advanced Practice Midwife

## 2020-03-15 VITALS — Wt 122.0 lb

## 2020-03-15 DIAGNOSIS — Z3400 Encounter for supervision of normal first pregnancy, unspecified trimester: Secondary | ICD-10-CM

## 2020-03-15 DIAGNOSIS — Z3401 Encounter for supervision of normal first pregnancy, first trimester: Secondary | ICD-10-CM

## 2020-03-15 DIAGNOSIS — Z3689 Encounter for other specified antenatal screening: Secondary | ICD-10-CM | POA: Diagnosis not present

## 2020-03-15 DIAGNOSIS — Z3A01 Less than 8 weeks gestation of pregnancy: Secondary | ICD-10-CM | POA: Diagnosis not present

## 2020-03-15 DIAGNOSIS — Z3A08 8 weeks gestation of pregnancy: Secondary | ICD-10-CM

## 2020-03-15 LAB — POCT URINALYSIS DIPSTICK OB: Glucose, UA: NEGATIVE

## 2020-03-15 NOTE — Progress Notes (Signed)
  Routine Prenatal Care Visit  Subjective  Chelsea Andrade is a 19 y.o. G1P0 at [redacted]w[redacted]d being seen today for ongoing prenatal care.  She is currently monitored for the following issues for this high-risk pregnancy and has ASTHMA; CONTUSION OF FOOT; Drug overdose; Ingestion of unknown medication; Drug ingestion; MDD (major depressive disorder), recurrent severe, without psychosis (HCC); Generalized anxiety disorder; Attention deficit hyperactivity disorder (ADHD), combined type, moderate; Herpes simplex type 2 (HSV-2) infection affecting pregnancy, antepartum; and Supervision of normal first pregnancy, antepartum on their problem list.  ----------------------------------------------------------------------------------- Patient reports doing well. She is figuring out family issues associated with the pregnancy and reports mood is ok- no different than usual. Discussed possible pregnancy mood issues and reinforced need to let us know of worsening.  We discussed results of dating scan.  . Vag. Bleeding: None.   . Leaking Fluid denies.  ----------------------------------------------------------------------------------- The following portions of the patient's history were reviewed and updated as appropriate: allergies, current medications, past family history, past medical history, past social history, past surgical history and problem list. Problem list updated.  Objective  Weight 122 lb (55.3 kg), last menstrual period 01/17/2020. Pregravid weight 120 lb (54.4 kg) Total Weight Gain 2 lb (0.907 kg) Urinalysis: Urine Protein    Urine Glucose    Fetal Status: Fetal Heart Rate (bpm): 152          Dating scan: [redacted]w[redacted]d with EDD of 11/11, no adjustment of EDD for 4 day difference  General:  Alert, oriented and cooperative. Patient is in no acute distress.  Skin: Skin is warm and dry. No rash noted.   Cardiovascular: Normal heart rate noted  Respiratory: Normal respiratory effort, no problems with respiration  noted  Abdomen: Soft, gravid, appropriate for gestational age.       Pelvic:  Cervical exam deferred        Extremities: Normal range of motion.     Mental Status: Normal mood and affect. Normal behavior. Normal judgment and thought content.   Assessment   19 y.o. G1P0 at [redacted]w[redacted]d by  10/23/2020, by Last Menstrual Period presenting for routine prenatal visit  Plan   pregnancy Problems (from 03/09/20 to present)    Problem Noted Resolved   Herpes simplex type 2 (HSV-2) infection affecting pregnancy, antepartum 03/09/2020 by Vena Austria, MD No   Supervision of normal first pregnancy, antepartum 03/09/2020 by Vena Austria, MD No       Preterm labor symptoms and general obstetric precautions including but not limited to vaginal bleeding, contractions, leaking of fluid and fetal movement were reviewed in detail with the patient.   Return in about 2 weeks (around 03/29/2020) for rob.  Tresea Mall, CNM 03/15/2020 4:12 PM

## 2020-03-15 NOTE — Progress Notes (Signed)
ROB Dating scan 

## 2020-03-24 ENCOUNTER — Telehealth: Payer: Self-pay

## 2020-03-24 NOTE — Telephone Encounter (Signed)
Patient reports her morning sickness has picked up in the past week to all day long. She's requesting a Dr. Note for being out of work the past 2 days and also for today and tomorrow. Cb#[phone]

## 2020-03-24 NOTE — Telephone Encounter (Signed)
Advised per Tresea Mall secure chat response that we can only provide letter that states patient is pregnancy and may have intermittent episodes of sickness that may cause absence from work. Patient reports she isn't having n/v every day, it's roughly qod but 3-6 times daily. She is agreeable to trying meds for nausea, but hesitant to take anything that will make her sleepy. Letter created/sent thru my chart.

## 2020-03-29 ENCOUNTER — Other Ambulatory Visit: Payer: Self-pay | Admitting: Obstetrics and Gynecology

## 2020-03-29 DIAGNOSIS — O210 Mild hyperemesis gravidarum: Secondary | ICD-10-CM

## 2020-03-29 MED ORDER — ONDANSETRON 4 MG PO TBDP: 4.0000 mg | ORAL_TABLET | Freq: Four times a day (QID) | ORAL | 3 refills | Status: DC | PRN

## 2020-03-29 MED ORDER — DOCUSATE SODIUM 100 MG PO CAPS: 100.0000 mg | ORAL_CAPSULE | Freq: Two times a day (BID) | ORAL | 2 refills | Status: DC | PRN

## 2020-03-29 MED ORDER — PROMETHAZINE HCL 25 MG PO TABS: 25.0000 mg | ORAL_TABLET | Freq: Four times a day (QID) | ORAL | 3 refills | Status: DC | PRN

## 2020-03-29 NOTE — Telephone Encounter (Signed)
Called patient. Rx for medications sent.

## 2020-03-31 ENCOUNTER — Encounter: Payer: Medicaid Other | Admitting: Obstetrics and Gynecology

## 2020-04-08 ENCOUNTER — Encounter: Payer: Medicaid Other | Admitting: Obstetrics & Gynecology

## 2020-05-24 ENCOUNTER — Ambulatory Visit (INDEPENDENT_AMBULATORY_CARE_PROVIDER_SITE_OTHER): Payer: Medicaid Other | Admitting: Obstetrics and Gynecology

## 2020-05-24 ENCOUNTER — Encounter: Payer: Self-pay | Admitting: Obstetrics and Gynecology

## 2020-05-24 ENCOUNTER — Other Ambulatory Visit: Payer: Self-pay

## 2020-05-24 VITALS — BP 106/70 | Ht 65.0 in | Wt 118.4 lb

## 2020-05-24 DIAGNOSIS — Z3A18 18 weeks gestation of pregnancy: Secondary | ICD-10-CM

## 2020-05-24 DIAGNOSIS — Z3400 Encounter for supervision of normal first pregnancy, unspecified trimester: Secondary | ICD-10-CM

## 2020-05-24 DIAGNOSIS — O210 Mild hyperemesis gravidarum: Secondary | ICD-10-CM

## 2020-05-24 DIAGNOSIS — Z1379 Encounter for other screening for genetic and chromosomal anomalies: Secondary | ICD-10-CM

## 2020-05-24 MED ORDER — ONDANSETRON 4 MG PO TBDP: 4.0000 mg | ORAL_TABLET | Freq: Four times a day (QID) | ORAL | 3 refills | Status: DC | PRN

## 2020-05-24 MED ORDER — PROMETHAZINE HCL 25 MG PO TABS: 25.0000 mg | ORAL_TABLET | Freq: Four times a day (QID) | ORAL | 3 refills | Status: DC | PRN

## 2020-05-24 NOTE — Progress Notes (Signed)
Routine Prenatal Care Visit  Subjective  Chelsea Andrade is a 19 y.o. G1P0 at 61w2dbeing seen today for ongoing prenatal care.  She is currently monitored for the following issues for this low-risk pregnancy and has ASTHMA; CONTUSION OF FOOT; Drug overdose; Ingestion of unknown medication; Drug ingestion; MDD (major depressive disorder), recurrent severe, without psychosis ([place]; Generalized anxiety disorder; Attention deficit hyperactivity disorder (ADHD), combined type, moderate; Herpes simplex type 2 (HSV-2) infection affecting pregnancy, antepartum; and Supervision of normal first pregnancy, antepartum on their problem list.  ----------------------------------------------------------------------------------- Patient reports that she has missed several obstetrical appointments because she was considering an abortion.  She reports that when she is at the abortion clinic she changed her mind and decided to keep the pregnancy.  She reports that she has not yet told her parents about the pregnancy and worries that they will be upset.  She reports that she is no longer with the father the baby.  She reports that her parents do not have a good opinion of the father the father the baby.  She reports that she has told her one friend who is here today with her at the visit about her pregnancy.  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  She has lost about 6 pounds.  She reports that she is food and smell aversions.  She is staying hydrated but sometimes eats and drinks very little in the day.  She is not taking anything to help her with nausea. She would like to do genetic screening she is waiting for the gender of the baby and would like the gender to be a surprise.  Contractions: Not present. Vag. Bleeding: None.   . Denies leaking of fluid.    ----------------------------------------------------------------------------------- The following portions of the patient's history were reviewed and updated as appropriate: allergies, current medications, past family history, past medical history, past social history, past surgical history and problem list. Problem list updated.   Objective  Blood pressure 106/70, height '5\' 5"'$  (1.651 m), weight 118 lb 6.4 oz (53.7 kg), last menstrual period 01/17/2020. Pregravid weight 120 lb (54.4 kg) Total Weight Gain -1 lb 9.6 oz (-0.726 kg) Urinalysis:      Fetal Status: Fetal Heart Rate (bpm): 150         General:  Alert, oriented and cooperative. Patient is in no acute distress.  Skin: Skin is warm and dry. No rash noted.   Cardiovascular: Normal heart rate noted  Respiratory: Normal respiratory effort, no problems with respiration noted  Abdomen: Soft, gravid, appropriate for gestational age. Pain/Pressure: Absent     Pelvic:  Cervical exam deferred        Extremities: Normal range of motion.  Edema: None  Mental Status: Normal mood and affect. Normal behavior. Normal judgment and thought content.     Assessment   19y.o. G1P0 at 19w2dy  10/23/2020, by Last Menstrual Period presenting for routine prenatal visit  Plan   pregnancy Problems (from 03/09/20 to present)    Problem Noted Resolved   Herpes simplex type 2 (HSV-2) infection affecting pregnancy, antepartum 03/09/2020 by StMalachy MoodMD No   Supervision of normal first pregnancy, antepartum 03/09/2020 by StMalachy MoodMD No   Overview Addendum 05/24/2020  4:24 PM by ScHomero FellersMDViolaDating By LMP c/w 7wCharmayne Sheer  u/s Blood type: A/Positive/-- (03/24 1434)   Genetic Screen  Antibody:Negative (03/24 1434)  Anatomic [place]  Rubella: 10.50 (03/24 1434)  Varicella: immune  GTT 28 wk:  RPR: Non Reactive (03/24 1434)   Rhogam  not needed HBsAg: Negative (03/24 1434)   Vaccines TDAP:                        Flu Shot: HIV: Non Reactive (03/24 1434)   Baby Food                                GBS:   Contraception  Pap: under 21  CBB     CS/VBAC    Support Person               Maternity 20 today. Discussed with the patient strategies for speaking to her parents about her pregnancy. Referral made to [facility] the pregnancy care manager.  Give the patient Rose's phone number. Patient will return in 2 weeks for a anatomy ultrasound. Patient was given prenatal vitamin samples. Discussed with the patient a plan for hyperemesis.  Provided her with medications for Phenergan and Zofran.  Discussed over-the-counter medications that would be available as well.  Discussed that reasons for hospital admission includes loss of weight when she has met the criteria for as well as dehydration or inability to keep down food or water.  Encouraged the patient to contact us or go to the hospital if she has any symptoms. Discussed Covid vaccination with the patient, provided patient with handouts.  Gestational age appropriate obstetric precautions including but not limited to vaginal bleeding, contractions, leaking of fluid and fetal movement were reviewed in detail with the patient.    Return in about 2 weeks (around 06/07/2020) for [place] and [place], anatomy in person.  Homero Fellers MD [facility], [facility] 05/24/2020, 5:14 PM

## 2020-05-24 NOTE — Patient Instructions (Addendum)
Rose- Care Manager [phone]  Initial steps to help :   B6 (pyridoxine) 25 mg,  3-4 times a day- 200 mg a day total Unisom (doxylamine) 25 mg at bedtime **B6 and Unisom are available as a combination prescription medications called diclegis and bonjesta  B1 (thiamin)  50-100 mg 1-2 a day-  100 mg a day total  Continue prenatal vitamin with iron and thiamin. If it is not tolerated switch to 1 mg of folic acid.  Can add medication for gastric reflux if needed.  Subsequent steps to be added to B1, B6, and Unisom:  1. Antihistamine (one of the following medications) Dramamine      25-50 mg every 4-6 hours Benadryl      25-50 mg every 4-6 hours Meclizine      25 mg every 6 hours  2. Dopamine Antagonist (one of the following medications) Metoclopramide  (Reglan)  5-10 mg every 6-8 hours         PO Promethazine   (Phenergan)   12.5-25 mg every 4-6 hours      PO or rectal Prochlorperazine  (Compazine)  5-10 mg every 6-8 hours     25mg  BID rectally    Subsequent steps if there has still not been improvement in symptoms:  3. Daily stool softner:  Colace 100 mg twice a day  4. Ondansetron  (Zofran)   4-8 mg every 6-8 hours     Hyperemesis Gravidarum Hyperemesis gravidarum is a severe form of nausea and vomiting that happens during pregnancy. Hyperemesis is worse than morning sickness. It may cause you to have nausea or vomiting all day for many days. It may keep you from eating and drinking enough food and liquids, which can lead to dehydration, malnutrition, and weight loss. Hyperemesis usually occurs during the first half (the first 20 weeks) of pregnancy. It often goes away once a woman is in her second half of pregnancy. However, sometimes hyperemesis continues through an entire pregnancy. What are the causes? The cause of this condition is not known. It may be related to changes in chemicals (hormones) in the body during pregnancy, such as the high level of pregnancy hormone  (human chorionic gonadotropin) or the increase in the female sex hormone (estrogen). What are the signs or symptoms? Symptoms of this condition include:  Nausea that does not go away.  Vomiting that does not allow you to keep any food down.  Weight loss.  Body fluid loss (dehydration).  Having no desire to eat, or not liking food that you have previously enjoyed. How is this diagnosed? This condition may be diagnosed based on:  A physical exam.  Your medical history.  Your symptoms.  Blood tests.  Urine tests. How is this treated? This condition is managed by controlling symptoms. This may include:  Following an eating plan. This can help lessen nausea and vomiting.  Taking prescription medicines. An eating plan and medicines are often used together to help control symptoms. If medicines do not help relieve nausea and vomiting, you may need to receive fluids through an IV at the hospital. Follow these instructions at home: Eating and drinking   Avoid the following: ? Drinking fluids with meals. Try not to drink anything during the 30 minutes before and after your meals. ? Drinking more than 1 cup of fluid at a time. ? Eating foods that trigger your symptoms. These may include spicy foods, coffee, high-fat foods, very sweet foods, and acidic foods. ? Skipping meals. Nausea  can be more intense on an empty stomach. If you cannot tolerate food, do not force it. Try sucking on ice chips or other frozen items and make up for missed calories later. ? Lying down within 2 hours after eating. ? Being exposed to environmental triggers. These may include food smells, smoky rooms, closed spaces, rooms with strong smells, warm or humid places, overly loud and noisy rooms, and rooms with motion or flickering lights. Try eating meals in a well-ventilated area that is free of strong smells. ? Quick and sudden changes in your movement. ? Taking iron pills and multivitamins that contain  iron. If you take prescription iron pills, do not stop taking them unless your health care provider approves. ? Preparing food. The smell of food can spoil your appetite or trigger nausea.  To help relieve your symptoms: ? Listen to your body. Everyone is different and has different preferences. Find what works best for you. ? Eat and drink slowly. ? Eat 5-6 small meals daily instead of 3 large meals. Eating small meals and snacks can help you avoid an empty stomach. ? In the morning, before getting out of bed, eat a couple of crackers to avoid moving around on an empty stomach. ? Try eating starchy foods as these are usually tolerated well. Examples include cereal, toast, bread, potatoes, pasta, rice, and pretzels. ? Include at least 1 serving of protein with your meals and snacks. Protein options include lean meats, poultry, seafood, beans, nuts, nut butters, eggs, cheese, and yogurt. ? Try eating a protein-rich snack before bed. Examples of a protein-rick snack include cheese and crackers or a peanut butter sandwich made with 1 slice of whole-wheat bread and 1 tsp (5 g) of peanut butter. ? Eat or suck on things that have ginger in them. It may help relieve nausea. Add  tsp ground ginger to hot tea or choose ginger tea. ? Try drinking 100% fruit juice or an electrolyte drink. An electrolyte drink contains sodium, potassium, and chloride. ? Drink fluids that are cold, clear, and carbonated or sour. Examples include lemonade, ginger ale, lemon-lime soda, ice water, and sparkling water. ? Brush your teeth or use a mouth rinse after meals. ? Talk with your health care provider about starting a supplement of vitamin B6. General instructions  Take over-the-counter and prescription medicines only as told by your health care provider.  Follow instructions from your health care provider about eating or drinking restrictions.  Continue to take your prenatal vitamins as told by your health care  provider. If you are having trouble taking your prenatal vitamins, talk with your health care provider about different options.  Keep all follow-up and pre-birth (prenatal) visits as told by your health care provider. This is important. Contact a health care provider if:  You have pain in your abdomen.  You have a severe headache.  You have vision problems.  You are losing weight.  You feel weak or dizzy. Get help right away if:  You cannot drink fluids without vomiting.  You vomit blood.  You have constant nausea and vomiting.  You are very weak.  You faint.  You have a fever and your symptoms suddenly get worse. Summary  Hyperemesis gravidarum is a severe form of nausea and vomiting that happens during pregnancy.  Making some changes to your eating habits may help relieve nausea and vomiting.  This condition may be managed with medicine.  If medicines do not help relieve nausea and vomiting, you may need  to receive fluids through an IV at the hospital. This information is not intended to replace advice given to you by your health care provider. Make sure you discuss any questions you have with your health care provider. Document Revised: 12/23/2017 Document Reviewed: 08/01/2016 Elsevier Patient Education  2020 [employer].   Second Trimester of Pregnancy The second trimester is from week 14 through week 27 (months 4 through 6). The second trimester is often a time when you feel your best. Your body has adjusted to being pregnant, and you begin to feel better physically. Usually, morning sickness has lessened or quit completely, you may have more energy, and you may have an increase in appetite. The second trimester is also a time when the fetus is growing rapidly. At the end of the sixth month, the fetus is about 9 inches long and weighs about 1 pounds. You will likely begin to feel the baby move (quickening) between 16 and 20 weeks of pregnancy. Body changes during  your second trimester Your body continues to go through many changes during your second trimester. The changes vary from woman to woman.  Your weight will continue to increase. You will notice your lower abdomen bulging out.  You may begin to get stretch marks on your hips, abdomen, and breasts.  You may develop headaches that can be relieved by medicines. The medicines should be approved by your health care provider.  You may urinate more often because the fetus is pressing on your bladder.  You may develop or continue to have heartburn as a result of your pregnancy.  You may develop constipation because certain hormones are causing the muscles that push waste through your intestines to slow down.  You may develop hemorrhoids or swollen, bulging veins (varicose veins).  You may have back pain. This is caused by: ? Weight gain. ? Pregnancy hormones that are relaxing the joints in your pelvis. ? A shift in weight and the muscles that support your balance.  Your breasts will continue to grow and they will continue to become tender.  Your gums may bleed and may be sensitive to brushing and flossing.  Dark spots or blotches (chloasma, mask of pregnancy) may develop on your face. This will likely fade after the baby is born.  A dark line from your belly button to the pubic area (linea nigra) may appear. This will likely fade after the baby is born.  You may have changes in your hair. These can include thickening of your hair, rapid growth, and changes in texture. Some women also have hair loss during or after pregnancy, or hair that feels dry or thin. Your hair will most likely return to normal after your baby is born. What to expect at prenatal visits During a routine prenatal visit:  You will be weighed to make sure you and the fetus are growing normally.  Your blood pressure will be taken.  Your abdomen will be measured to track your baby's growth.  The fetal heartbeat will be  listened to.  Any test results from the previous visit will be discussed. Your health care provider may ask you:  How you are feeling.  If you are feeling the baby move.  If you have had any abnormal symptoms, such as leaking fluid, bleeding, severe headaches, or abdominal cramping.  If you are using any tobacco products, including cigarettes, chewing tobacco, and electronic cigarettes.  If you have any questions. Other tests that may be performed during your second trimester include:  Blood tests that check for: ? Low iron levels (anemia). ? High blood sugar that affects pregnant women (gestational diabetes) between 2224 and 28 weeks. ? Rh antibodies. This is to check for a protein on red blood cells (Rh factor).  Urine tests to check for infections, diabetes, or protein in the urine.  An ultrasound to confirm the proper growth and development of the baby.  An amniocentesis to check for possible genetic problems.  Fetal screens for spina bifida and Down syndrome.  HIV (human immunodeficiency virus) testing. Routine prenatal testing includes screening for HIV, unless you choose not to have this test. Follow these instructions at home: Medicines  Follow your health care provider's instructions regarding medicine use. Specific medicines may be either safe or unsafe to take during pregnancy.  Take a prenatal vitamin that contains at least 600 micrograms (mcg) of folic acid.  If you develop constipation, try taking a stool softener if your health care provider approves. Eating and drinking   Eat a balanced diet that includes fresh fruits and vegetables, whole grains, good sources of protein such as meat, eggs, or tofu, and low-fat dairy. Your health care provider will help you determine the amount of weight gain that is right for you.  Avoid raw meat and uncooked cheese. These carry germs that can cause birth defects in the baby.  If you have low calcium intake from food, talk  to your health care provider about whether you should take a daily calcium supplement.  Limit foods that are high in fat and processed sugars, such as fried and sweet foods.  To prevent constipation: ? Drink enough fluid to keep your urine clear or pale yellow. ? Eat foods that are high in fiber, such as fresh fruits and vegetables, whole grains, and beans. Activity  Exercise only as directed by your health care provider. Most women can continue their usual exercise routine during pregnancy. Try to exercise for 30 minutes at least 5 days a week. Stop exercising if you experience uterine contractions.  Avoid heavy lifting, wear low heel shoes, and practice good posture.  A sexual relationship may be continued unless your health care provider directs you otherwise. Relieving pain and discomfort  Wear a good support bra to prevent discomfort from breast tenderness.  Take warm sitz baths to soothe any pain or discomfort caused by hemorrhoids. Use hemorrhoid cream if your health care provider approves.  Rest with your legs elevated if you have leg cramps or low back pain.  If you develop varicose veins, wear support hose. Elevate your feet for 15 minutes, 3-4 times a day. Limit salt in your diet. Prenatal Care  Write down your questions. Take them to your prenatal visits.  Keep all your prenatal visits as told by your health care provider. This is important. Safety  Wear your seat belt at all times when driving.  Make a list of emergency phone numbers, including numbers for family, friends, the hospital, and police and fire departments. General instructions  Ask your health care provider for a referral to a local prenatal education class. Begin classes no later than the beginning of month 6 of your pregnancy.  Ask for help if you have counseling or nutritional needs during pregnancy. Your health care provider can offer advice or refer you to specialists for help with various  needs.  Do not use hot tubs, steam rooms, or saunas.  Do not douche or use tampons or scented sanitary pads.  Do not cross your legs  for long periods of time.  Avoid cat litter boxes and soil used by cats. These carry germs that can cause birth defects in the baby and possibly loss of the fetus by miscarriage or stillbirth.  Avoid all smoking, herbs, alcohol, and unprescribed drugs. Chemicals in these products can affect the formation and growth of the baby.  Do not use any products that contain nicotine or tobacco, such as cigarettes and e-cigarettes. If you need help quitting, ask your health care provider.  Visit your dentist if you have not gone yet during your pregnancy. Use a soft toothbrush to brush your teeth and be gentle when you floss. Contact a health care provider if:  You have dizziness.  You have mild pelvic cramps, pelvic pressure, or nagging pain in the abdominal area.  You have persistent nausea, vomiting, or diarrhea.  You have a bad smelling vaginal discharge.  You have pain when you urinate. Get help right away if:  You have a fever.  You are leaking fluid from your vagina.  You have spotting or bleeding from your vagina.  You have severe abdominal cramping or pain.  You have rapid weight gain or weight loss.  You have shortness of breath with chest pain.  You notice sudden or extreme swelling of your face, hands, ankles, feet, or legs.  You have not felt your baby move in over an hour.  You have severe headaches that do not go away when you take medicine.  You have vision changes. Summary  The second trimester is from week 14 through week 27 (months 4 through 6). It is also a time when the fetus is growing rapidly.  Your body goes through many changes during pregnancy. The changes vary from woman to woman.  Avoid all smoking, herbs, alcohol, and unprescribed drugs. These chemicals affect the formation and growth your baby.  Do not use any  tobacco products, such as cigarettes, chewing tobacco, and e-cigarettes. If you need help quitting, ask your health care provider.  Contact your health care provider if you have any questions. Keep all prenatal visits as told by your health care provider. This is important. This information is not intended to replace advice given to you by your health care provider. Make sure you discuss any questions you have with your health care provider. Document Revised: 03/27/2019 Document Reviewed: 01/08/2017 Elsevier Patient Education  2020 [employer].

## 2020-05-30 LAB — MATERNIT21 PLUS CORE+SCA
Fetal Fraction: 14
Monosomy X (Turner Syndrome): NOT DETECTED
Result (T21): NEGATIVE
Trisomy 13 (Patau syndrome): NEGATIVE
Trisomy 18 (Edwards syndrome): NEGATIVE
Trisomy 21 (Down syndrome): NEGATIVE
XXX (Triple X Syndrome): NOT DETECTED
XXY (Klinefelter Syndrome): NOT DETECTED
XYY (Jacobs Syndrome): NOT DETECTED

## 2020-05-31 NOTE — Progress Notes (Signed)
Please make this patient a gender envelope. Thank you.  Dr. Jerene Pitch

## 2020-06-08 ENCOUNTER — Ambulatory Visit (INDEPENDENT_AMBULATORY_CARE_PROVIDER_SITE_OTHER): Payer: Medicaid Other

## 2020-06-08 ENCOUNTER — Ambulatory Visit (INDEPENDENT_AMBULATORY_CARE_PROVIDER_SITE_OTHER): Payer: Medicaid Other | Admitting: Obstetrics and Gynecology

## 2020-06-08 ENCOUNTER — Other Ambulatory Visit: Payer: Self-pay

## 2020-06-08 VITALS — BP 100/70 | Wt 122.0 lb

## 2020-06-08 DIAGNOSIS — Z3400 Encounter for supervision of normal first pregnancy, unspecified trimester: Secondary | ICD-10-CM

## 2020-06-08 DIAGNOSIS — Z3A20 20 weeks gestation of pregnancy: Secondary | ICD-10-CM

## 2020-06-08 LAB — POCT URINALYSIS DIPSTICK OB
Glucose, UA: NEGATIVE
POC,PROTEIN,UA: NEGATIVE

## 2020-06-08 NOTE — Addendum Note (Signed)
Addended by: Cornelius Moras D on: 06/08/2020 11:42 AM   Modules accepted: Orders

## 2020-06-08 NOTE — Progress Notes (Signed)
Routine Prenatal Care Visit  Subjective  Chelsea Andrade is a 19 y.o. G1P0 at [redacted]w[redacted]d being seen today for ongoing prenatal care.  She is currently monitored for the following issues for this low-risk pregnancy and has ASTHMA; CONTUSION OF FOOT; Drug overdose; Ingestion of unknown medication; Drug ingestion; MDD (major depressive disorder), recurrent severe, without psychosis ([place]); Generalized anxiety disorder; Attention deficit hyperactivity disorder (ADHD), combined type, moderate; Herpes simplex type 2 (HSV-2) infection affecting pregnancy, antepartum; and Supervision of normal first pregnancy, antepartum on their problem list.  ----------------------------------------------------------------------------------- Patient reports no complaints.   Contractions: Not present. Vag. Bleeding: None.  Movement: Present. Denies leaking of fluid.  ----------------------------------------------------------------------------------- The following portions of the patient's history were reviewed and updated as appropriate: allergies, current medications, past family history, past medical history, past social history, past surgical history and problem list. Problem list updated.   Objective  Blood pressure 100/70, weight 122 lb (55.3 kg), last menstrual period 01/17/2020. Pregravid weight 120 lb (54.4 kg) Total Weight Gain 2 lb (0.907 kg) Urinalysis:      Fetal Status: Fetal Heart Rate (bpm): 150   Movement: Present     General:  Alert, oriented and cooperative. Patient is in no acute distress.  Skin: Skin is warm and dry. No rash noted.   Cardiovascular: Normal heart rate noted  Respiratory: Normal respiratory effort, no problems with respiration noted  Abdomen: Soft, gravid, appropriate for gestational age. Pain/Pressure: Absent     Pelvic:  Cervical exam deferred        Extremities: Normal range of motion.     ental Status: Normal mood and affect. Normal behavior. Normal judgment and thought  content.     Assessment   19 y.o. G1P0 at [redacted]w[redacted]d by  10/23/2020, by Last Menstrual Period presenting for routine prenatal visit  Plan   pregnancy Problems (from 03/09/20 to present)    Problem Noted Resolved   Herpes simplex type 2 (HSV-2) infection affecting pregnancy, antepartum 03/09/2020 by Malachy Mood, MD No   Supervision of normal first pregnancy, antepartum 03/09/2020 by Malachy Mood, MD No   Overview Addendum 05/24/2020  4:24 PM by Homero Fellers, [place] Prenatal Labs  Dating By LMP c/w 7w u/s Blood type: A/Positive/-- (03/24 1434)   Genetic Screen MaterniT21 normal XY Antibody:Negative (03/24 1434)  Anatomic [place] Complete 6/23 Rubella: 10.50 (03/24 1434)  Varicella: immune  GTT 28 wk:  RPR: Non Reactive (03/24 1434)   Rhogam  not needed HBsAg: Negative (03/24 1434)   Vaccines TDAP:                       Flu Shot: HIV: Non Reactive (03/24 1434)   Baby Food                                GBS:   Contraception  Pap: under 21  CBB     CS/VBAC    Support Person           Previous Version      US OB Comp + 14 Wk  Result Date: 06/08/2020 Patient Name: Chelsea Andrade DOB: [birth year] MRN: [id number] ULTRASOUND REPORT Location: [facility] Date of Service: 06/08/2020 Indications:Anatomy Ultrasound Findings: Nelda Marse[place] intrauterine pregnancy is visualized with FHR at 143 BPM. Biometrics give an (U/S) Gestational age of [redacted]w[redacted]d and an (U/S) EDD of 10/28/2020; this correlates with the clinically established Estimated Date of  Delivery: 10/23/20 Fetal presentation is Breech. EFW: 318 g ( 11 oz ). Placenta: anterior. Grade: 1 AFI: subjectively normal. Anatomic survey is complete and normal; Gender - surprise.  Impression: 1. [redacted]w[redacted]d Viable Singleton Intrauterine pregnancy by U/S. 2. (U/S) EDD is consistent with Clinically established Estimated Date of Delivery: 10/23/20 . 3. Normal Anatomy Scan Recommendations: 1.Clinical correlation with the patient's History and  Physical Exam. Deanna Artis, RT  There is a singleton gestation with subjectively normal amniotic fluid volume. The fetal biometry correlates with established dating. Detailed evaluation of the fetal anatomy was performed.The fetal anatomical survey appears within normal limits within the resolution of ultrasound as described above.  It must be noted that a normal ultrasound is unable to rule out fetal aneuploidy, subtle defects such as small ASD or VDS may also not be visible on imaging.  Vena Austria, MD, [facility], [facility] 06/08/2020, 11:25 AM    Gestational age appropriate obstetric precautions including but not limited to vaginal bleeding, contractions, leaking of fluid and fetal movement were reviewed in detail with the patient.    Return in about 4 weeks (around 07/06/2020) for ROB.  Vena Austria, MD, [facility], [facility] 06/08/2020, 11:36 AM

## 2020-07-08 ENCOUNTER — Other Ambulatory Visit: Payer: Self-pay

## 2020-07-08 ENCOUNTER — Ambulatory Visit (INDEPENDENT_AMBULATORY_CARE_PROVIDER_SITE_OTHER): Payer: Medicaid Other | Admitting: Advanced Practice Midwife

## 2020-07-08 ENCOUNTER — Encounter: Payer: Self-pay | Admitting: Advanced Practice Midwife

## 2020-07-08 ENCOUNTER — Encounter: Payer: Medicaid Other | Admitting: Obstetrics and Gynecology

## 2020-07-08 VITALS — BP 100/70 | Ht 65.0 in | Wt 128.2 lb

## 2020-07-08 DIAGNOSIS — Z130 Encounter for screening for diseases of the blood and blood-forming organs and certain disorders involving the immune mechanism: Secondary | ICD-10-CM

## 2020-07-08 DIAGNOSIS — Z131 Encounter for screening for diabetes mellitus: Secondary | ICD-10-CM

## 2020-07-08 DIAGNOSIS — Z3400 Encounter for supervision of normal first pregnancy, unspecified trimester: Secondary | ICD-10-CM

## 2020-07-08 DIAGNOSIS — O2612 Low weight gain in pregnancy, second trimester: Secondary | ICD-10-CM | POA: Insufficient documentation

## 2020-07-08 DIAGNOSIS — Z113 Encounter for screening for infections with a predominantly sexual mode of transmission: Secondary | ICD-10-CM

## 2020-07-08 DIAGNOSIS — Z3A24 24 weeks gestation of pregnancy: Secondary | ICD-10-CM

## 2020-07-08 LAB — POCT URINALYSIS DIPSTICK OB
Glucose, UA: NEGATIVE
POC,PROTEIN,UA: NEGATIVE

## 2020-07-08 NOTE — Addendum Note (Signed)
Addended by: Clement Husbands A on: 07/08/2020 04:41 PM   Modules accepted: Orders

## 2020-07-08 NOTE — Progress Notes (Signed)
Routine Prenatal Care Visit  Subjective  Chelsea Andrade is a 19 y.o. G1P0 at [redacted]w[redacted]d being seen today for ongoing prenatal care.  She is currently monitored for the following issues for this low-risk pregnancy and has ASTHMA; CONTUSION OF FOOT; Drug overdose; Ingestion of unknown medication; Drug ingestion; MDD (major depressive disorder), recurrent severe, without psychosis (HCC); Generalized anxiety disorder; Attention deficit hyperactivity disorder (ADHD), combined type, moderate; Herpes simplex type 2 (HSV-2) infection affecting pregnancy, antepartum; Supervision of normal first pregnancy, antepartum; and Poor weight gain of pregnancy, second trimester on their problem list.  ----------------------------------------------------------------------------------- Patient reports her appetite fluctuates- some days good and other days she can only eat 1 or 2 meals. We discussed focus on healthy calorie dense foods and doing a growth scan at next visit. Fundal height within normal, however total weight gain to this point is 8 pounds.   Contractions: Not present. Vag. Bleeding: None.  Movement: Present. Leaking Fluid denies.  ----------------------------------------------------------------------------------- The following portions of the patient's history were reviewed and updated as appropriate: allergies, current medications, past family history, past medical history, past social history, past surgical history and problem list. Problem list updated.  Objective  Blood pressure 100/70, height 5\' 5"  (1.651 m), weight 128 lb 3.2 oz (58.2 kg), last menstrual period 01/17/2020. Pregravid weight 120 lb (54.4 kg) Total Weight Gain 8 lb 3.2 oz (3.719 kg) Urinalysis: Urine Protein    Urine Glucose    Fetal Status: Fetal Heart Rate (bpm): 144 Fundal Height: 24 cm Movement: Present     General:  Alert, oriented and cooperative. Patient is in no acute distress.  Skin: Skin is warm and dry. No rash noted.     Cardiovascular: Normal heart rate noted  Respiratory: Normal respiratory effort, no problems with respiration noted  Abdomen: Soft, gravid, appropriate for gestational age. Pain/Pressure: Absent     Pelvic:  Cervical exam deferred        Extremities: Normal range of motion.  Edema: None  Mental Status: Normal mood and affect. Normal behavior. Normal judgment and thought content.   Assessment   19 y.o. G1P0 at [redacted]w[redacted]d by  10/23/2020, by Last Menstrual Period presenting for routine prenatal visit  Plan   pregnancy Problems (from 03/09/20 to present)    Problem Noted Resolved   Herpes simplex type 2 (HSV-2) infection affecting pregnancy, antepartum 03/09/2020 by 03/11/2020, MD No   Supervision of normal first pregnancy, antepartum 03/09/2020 by 03/11/2020, MD No   Overview Addendum 06/08/2020 11:35 AM by 06/10/2020, MD    Clinic [facility] Prenatal Labs  Dating By LMP c/w 7w u/s Blood type: A/Positive/-- (03/24 1434)   Genetic Screen MaterniT21 normal XY Antibody:Negative (03/24 1434)  Anatomic 05-28-1986 Complete 6/23 Rubella: 10.50 (03/24 1434)  Varicella: immune  GTT 28 wk:  RPR: Non Reactive (03/24 1434)   Rhogam  not needed HBsAg: Negative (03/24 1434)   Vaccines TDAP:                       Flu Shot: HIV: Non Reactive (03/24 1434)   Baby Food                                GBS:   Contraception  Pap: under 21  CBB     CS/VBAC    Support Person           Previous Version       Preterm  labor symptoms and general obstetric precautions including but not limited to vaginal bleeding, contractions, leaking of fluid and fetal movement were reviewed in detail with the patient. Please refer to After Visit Summary for other counseling recommendations.   Return in about 4 weeks (around 08/05/2020) for growth scan, 28 wk labs and rob.  Tresea Mall, CNM 07/08/2020 4:33 PM

## 2020-08-05 ENCOUNTER — Other Ambulatory Visit: Payer: Self-pay

## 2020-08-05 ENCOUNTER — Ambulatory Visit (INDEPENDENT_AMBULATORY_CARE_PROVIDER_SITE_OTHER): Payer: Medicaid Other

## 2020-08-05 ENCOUNTER — Other Ambulatory Visit: Payer: Medicaid Other

## 2020-08-05 ENCOUNTER — Ambulatory Visit (INDEPENDENT_AMBULATORY_CARE_PROVIDER_SITE_OTHER): Payer: Medicaid Other | Admitting: Obstetrics and Gynecology

## 2020-08-05 VITALS — BP 102/60 | Wt 135.0 lb

## 2020-08-05 DIAGNOSIS — O2613 Low weight gain in pregnancy, third trimester: Secondary | ICD-10-CM

## 2020-08-05 DIAGNOSIS — Z3A28 28 weeks gestation of pregnancy: Secondary | ICD-10-CM

## 2020-08-05 DIAGNOSIS — Z130 Encounter for screening for diseases of the blood and blood-forming organs and certain disorders involving the immune mechanism: Secondary | ICD-10-CM

## 2020-08-05 DIAGNOSIS — Z3400 Encounter for supervision of normal first pregnancy, unspecified trimester: Secondary | ICD-10-CM

## 2020-08-05 DIAGNOSIS — Z113 Encounter for screening for infections with a predominantly sexual mode of transmission: Secondary | ICD-10-CM

## 2020-08-05 DIAGNOSIS — Z131 Encounter for screening for diabetes mellitus: Secondary | ICD-10-CM

## 2020-08-05 DIAGNOSIS — O2612 Low weight gain in pregnancy, second trimester: Secondary | ICD-10-CM

## 2020-08-05 LAB — POCT URINALYSIS DIPSTICK OB
Glucose, UA: NEGATIVE
POC,PROTEIN,UA: NEGATIVE

## 2020-08-05 NOTE — Progress Notes (Signed)
ROB Growth scan today 28 week labs

## 2020-08-05 NOTE — Progress Notes (Signed)
Routine Prenatal Care Visit  Subjective  Chelsea Andrade is a 19 y.o. G1P0 at [redacted]w[redacted]d being seen today for ongoing prenatal care.  She is currently monitored for the following issues for this low-risk pregnancy and has ASTHMA; CONTUSION OF FOOT; Drug overdose; Ingestion of unknown medication; Drug ingestion; MDD (major depressive disorder), recurrent severe, without psychosis (HCC); Generalized anxiety disorder; Attention deficit hyperactivity disorder (ADHD), combined type, moderate; Herpes simplex type 2 (HSV-2) infection affecting pregnancy, antepartum; Supervision of normal first pregnancy, antepartum; and Poor weight gain of pregnancy, second trimester on their problem list.  ----------------------------------------------------------------------------------- Patient reports no complaints.   Contractions: Not present. Vag. Bleeding: None.  Movement: Present. Denies leaking of fluid.  ----------------------------------------------------------------------------------- The following portions of the patient's history were reviewed and updated as appropriate: allergies, current medications, past family history, past medical history, past social history, past surgical history and problem list. Problem list updated.   Objective  Blood pressure 102/60, weight 135 lb (61.2 kg), last menstrual period 01/17/2020. Pregravid weight 120 lb (54.4 kg) Total Weight Gain 15 lb (6.804 kg)  Body mass index is 22.47 kg/m.  Urinalysis:      Fetal Status: Fetal Heart Rate (bpm): 140 Fundal Height: 26 cm Movement: Present     General:  Alert, oriented and cooperative. Patient is in no acute distress.  Skin: Skin is warm and dry. No rash noted.   Cardiovascular: Normal heart rate noted  Respiratory: Normal respiratory effort, no problems with respiration noted  Abdomen: Soft, gravid, appropriate for gestational age. Pain/Pressure: Absent     Pelvic:  Cervical exam deferred        Extremities: Normal  range of motion.     ental Status: Normal mood and affect. Normal behavior. Normal judgment and thought content.   US OB Follow Up  Result Date: 08/05/2020 Patient Name: Mykal Mosqueda DOB: May 06, 2001 MRN: [id number] ULTRASOUND REPORT Location: [facility] Date of Service: 08/05/2020 Indications:growth/afi Findings: Mason Jim intrauterine pregnancy is visualized with FHR at 134 BPM. Biometrics give an (U/S) Gestational age of [redacted]w[redacted]d and an (U/S) EDD of 10/25/2020; this correlates with the clinically established Estimated Date of Delivery: 10/23/20. Fetal presentation is Cephalic. Placenta: anterior. Grade: 1 AFI: 10.5 cm Growth percentile is 46.4%.  AC percentile is 29.0%. EFW: 1222 g  ( 2 lb 11 oz ) Impression: 1. [redacted]w[redacted]d Viable Singleton Intrauterine pregnancy previously established criteria. 2. Growth is 46.4 %ile.  AFI is 10.5 cm. Recommendations: 1.Clinical correlation with the patient's History and Physical Exam. Deanna Artis, RT There is a singleton gestation with normal amniotic fluid volume. The fetal biometry correlates with established dating.  Limited fetal anatomy was performed.The visualized fetal anatomy appears within normal limits within the resolution of ultrasound as described above.  It must be noted that a normal ultrasound is unable to rule out fetal aneuploidy.  Vena Austria, MD, FACOG [facility], [facility] 08/05/2020, 10:23 AM    Assessment   19 y.o. G1P0 at [redacted]w[redacted]d by  10/23/2020, by Last Menstrual Period presenting for routine prenatal visit  Plan   pregnancy Problems (from 03/09/20 to present)    Problem Noted Resolved   Herpes simplex type 2 (HSV-2) infection affecting pregnancy, antepartum 03/09/2020 by Vena Austria, MD No   Supervision of normal first pregnancy, antepartum 03/09/2020 by Vena Austria, MD No   Overview Addendum 06/08/2020 11:35 AM by Vena Austria, MD    [facility]inic [facility] Prenatal Labs  Dating By LMP c/w 7w u/s Blood  type: A/Positive/-- (03/24  1434)   Genetic Screen MaterniT21 normal XY Antibody:Negative (03/24 1434)  Anatomic [place] Complete 6/23 Rubella: 10.50 (03/24 1434)  Varicella: immune  GTT 28 wk:  RPR: Non Reactive (03/24 1434)   Rhogam  not needed HBsAg: Negative (03/24 1434)   Vaccines TDAP:                       Flu Shot: HIV: Non Reactive (03/24 1434)   Baby Food                                GBS:   Contraception  Pap: under 21  CBB     CS/VBAC    Support Person           Previous Version       Gestational age appropriate obstetric precautions including but not limited to vaginal bleeding, contractions, leaking of fluid and fetal movement were reviewed in detail with the patient.    - growth scan up normal growth - 28 week labs today  Return in about 2 weeks (around 08/19/2020) for ROB.  Vena Austria, MD, Evern Core [facility], [facility] 08/05/2020, 10:45 AM

## 2020-08-06 LAB — 28 WEEK RH+PANEL
Basophils Absolute: 0 10*3/uL (ref 0.0–0.2)
Basos: 0 %
EOS (ABSOLUTE): 0.1 10*3/uL (ref 0.0–0.4)
Eos: 1 %
Gestational Diabetes Screen: 151 mg/dL — ABNORMAL HIGH (ref 65–139)
HIV Screen 4th Generation wRfx: NONREACTIVE
Hematocrit: 31.6 % — ABNORMAL LOW (ref 34.0–46.6)
Hemoglobin: 10.8 g/dL — ABNORMAL LOW (ref 11.1–15.9)
Immature Grans (Abs): 0.1 10*3/uL (ref 0.0–0.1)
Immature Granulocytes: 1 %
Lymphocytes Absolute: 1.4 10*3/uL (ref 0.7–3.1)
Lymphs: 17 %
MCH: 30.9 pg (ref 26.6–33.0)
MCHC: 34.2 g/dL (ref 31.5–35.7)
MCV: 90 fL (ref 79–97)
Monocytes Absolute: 0.5 10*3/uL (ref 0.1–0.9)
Monocytes: 6 %
Neutrophils Absolute: 6.1 10*3/uL (ref 1.4–7.0)
Neutrophils: 75 %
Platelets: 207 10*3/uL (ref 150–450)
RBC: 3.5 x10E6/uL — ABNORMAL LOW (ref 3.77–5.28)
RDW: 11.9 % (ref 11.7–15.4)
RPR Ser Ql: NONREACTIVE
WBC: 8.2 10*3/uL (ref 3.4–10.8)

## 2020-08-08 ENCOUNTER — Other Ambulatory Visit: Payer: Self-pay | Admitting: Obstetrics and Gynecology

## 2020-08-08 ENCOUNTER — Telehealth: Payer: Self-pay

## 2020-08-08 DIAGNOSIS — Z3400 Encounter for supervision of normal first pregnancy, unspecified trimester: Secondary | ICD-10-CM

## 2020-08-08 DIAGNOSIS — R7309 Other abnormal glucose: Secondary | ICD-10-CM | POA: Insufficient documentation

## 2020-08-08 DIAGNOSIS — O99013 Anemia complicating pregnancy, third trimester: Secondary | ICD-10-CM | POA: Insufficient documentation

## 2020-08-08 MED ORDER — FERROUS SULFATE 325 (65 FE) MG PO TABS: 325.0000 mg | ORAL_TABLET | Freq: Every day | ORAL | 6 refills | Status: DC

## 2020-08-08 MED ORDER — CONCEPT DHA 53.5-38-1 MG PO CAPS
1.0000 | ORAL_CAPSULE | Freq: Every day | ORAL | 3 refills | Status: DC
Start: 2020-08-08 — End: 2020-08-08

## 2020-08-08 MED ORDER — CONCEPT DHA 53.5-38-1 MG PO CAPS: 1.0000 | ORAL_CAPSULE | Freq: Every day | ORAL | 11 refills | Status: AC

## 2020-08-08 NOTE — Telephone Encounter (Signed)
Patient received lab results this morning for abnormal glucose in my chart and would like to discuss. Cb#[id number]

## 2020-08-08 NOTE — Progress Notes (Signed)
f °

## 2020-08-09 ENCOUNTER — Telehealth: Payer: Self-pay | Admitting: Obstetrics and Gynecology

## 2020-08-09 NOTE — Telephone Encounter (Signed)
-----   Message from Vena Austria, MD sent at 08/08/2020  5:02 PM EDT ----- 3-hr OGTT sometime later this week order in patient aware

## 2020-08-09 NOTE — Telephone Encounter (Signed)
Patient is scheduled for 08/18/20 for 3 gtt

## 2020-08-09 NOTE — Telephone Encounter (Signed)
Called and left voicemail for patient to call back to be scheduled. 

## 2020-08-18 ENCOUNTER — Other Ambulatory Visit: Payer: Medicaid Other

## 2020-08-18 ENCOUNTER — Other Ambulatory Visit: Payer: Self-pay

## 2020-08-18 DIAGNOSIS — R7309 Other abnormal glucose: Secondary | ICD-10-CM

## 2020-08-18 DIAGNOSIS — Z3400 Encounter for supervision of normal first pregnancy, unspecified trimester: Secondary | ICD-10-CM

## 2020-08-19 LAB — GESTATIONAL GLUCOSE TOLERANCE
Glucose, Fasting: 76 mg/dL (ref 65–94)
Glucose, GTT - 1 Hour: 170 mg/dL (ref 65–179)
Glucose, GTT - 2 Hour: 75 mg/dL (ref 65–154)
Glucose, GTT - 3 Hour: 98 mg/dL (ref 65–139)

## 2020-08-23 ENCOUNTER — Ambulatory Visit (INDEPENDENT_AMBULATORY_CARE_PROVIDER_SITE_OTHER): Payer: Medicaid Other | Admitting: Obstetrics and Gynecology

## 2020-08-23 ENCOUNTER — Other Ambulatory Visit: Payer: Self-pay

## 2020-08-23 VITALS — BP 102/54 | Wt 137.0 lb

## 2020-08-23 DIAGNOSIS — Z3A31 31 weeks gestation of pregnancy: Secondary | ICD-10-CM

## 2020-08-23 DIAGNOSIS — O2612 Low weight gain in pregnancy, second trimester: Secondary | ICD-10-CM

## 2020-08-23 DIAGNOSIS — Z3400 Encounter for supervision of normal first pregnancy, unspecified trimester: Secondary | ICD-10-CM

## 2020-08-23 NOTE — Progress Notes (Signed)
ROB

## 2020-08-23 NOTE — Progress Notes (Signed)
Routine Prenatal Care Visit  Subjective  Chelsea Andrade is a 20 y.o. G1P0 at [redacted]w[redacted]d being seen today for ongoing prenatal care.  She is currently monitored for the following issues for this low-risk pregnancy and has ASTHMA; CONTUSION OF FOOT; Drug overdose; Ingestion of unknown medication; Drug ingestion; MDD (major depressive disorder), recurrent severe, without psychosis (HCC); Generalized anxiety disorder; Attention deficit hyperactivity disorder (ADHD), combined type, moderate; Herpes simplex type 2 (HSV-2) infection affecting pregnancy, antepartum; Supervision of normal first pregnancy, antepartum; Poor weight gain of pregnancy, second trimester; Abnormal glucose tolerance test; and Anemia affecting pregnancy in third trimester on their problem list.  ----------------------------------------------------------------------------------- Patient reports no complaints.   Contractions: Not present. Vag. Bleeding: None.  Movement: Present. Denies leaking of fluid.  ----------------------------------------------------------------------------------- The following portions of the patient's history were reviewed and updated as appropriate: allergies, current medications, past family history, past medical history, past social history, past surgical history and problem list. Problem list updated.   Objective  Blood pressure (!) 102/54, weight 137 lb (62.1 kg), last menstrual period 01/17/2020. Pregravid weight 120 lb (54.4 kg) Total Weight Gain 17 lb (7.711 kg) Urinalysis:      Fetal Status: Fetal Heart Rate (bpm): 160 Fundal Height: 29 cm Movement: Present  Presentation: Vertex  General:  Alert, oriented and cooperative. Patient is in no acute distress.  Skin: Skin is warm and dry. No rash noted.   Cardiovascular: Normal heart rate noted  Respiratory: Normal respiratory effort, no problems with respiration noted  Abdomen: Soft, gravid, appropriate for gestational age. Pain/Pressure: Absent      Pelvic:  Cervical exam deferred        Extremities: Normal range of motion.     ental Status: Normal mood and affect. Normal behavior. Normal judgment and thought content.     Assessment   19 y.o. G1P0 at [redacted]w[redacted]d by  10/23/2020, by Last Menstrual Period presenting for routine prenatal visit  Plan   pregnancy Problems (from 03/09/20 to present)    Problem Noted Resolved   Herpes simplex type 2 (HSV-2) infection affecting pregnancy, antepartum 03/09/2020 by Vena Austria, MD No   Supervision of normal first pregnancy, antepartum 03/09/2020 by Vena Austria, MD No   Overview Addendum 08/19/2020  1:45 PM by Vena Austria, MD    [facility]inic [facility] Prenatal Labs  Dating By LMP c/w 7w u/s Blood type: A/Positive/-- (03/24 1434)   Genetic Screen MaterniT21 normal XY Antibody:Negative (03/24 1434)  Anatomic [place] Complete 6/23 Rubella: 10.50 (03/24 1434)  Varicella: immune  GTT 28 wk: 151 3-hr 76, 170, 75, 98 RPR: Non Reactive (03/24 1434)   Rhogam  not needed HBsAg: Negative (03/24 1434)   Vaccines TDAP:                       Flu Shot: HIV: Non Reactive (03/24 1434)   Baby Food                                GBS:   Contraception  Pap: under 21  CBB     CS/VBAC    Support Person           Previous Version       Gestational age appropriate obstetric precautions including but not limited to vaginal bleeding, contractions, leaking of fluid and fetal movement were reviewed in detail with the patient.    1) S<D 29 cm, also weight gain remain poor  will obtain 1 month follow up growth scan.  Normal growth 08/05/2020   Return in about 1 week (around 08/30/2020) for ROB and growth scan.  Vena Austria, MD, Evern Core [facility], [facility] 08/23/2020, 4:45 PM

## 2020-08-26 ENCOUNTER — Telehealth: Payer: Self-pay

## 2020-08-26 NOTE — Telephone Encounter (Signed)
Pt's mom, [name], calling; pt is having the abd pain again; is very lightheaded; what to do ?  Pt's # [phone]  (Mom not on DPR)  [facility]

## 2020-08-26 NOTE — Telephone Encounter (Signed)
I suppose she could be having gall bladder pain. Is the pain upper right?

## 2020-08-26 NOTE — Telephone Encounter (Signed)
LMVM to notify per JEG can also soak in tub with epsom salt, tylenol pm to help get some sleep.

## 2020-08-26 NOTE — Telephone Encounter (Signed)
Patient reports she is having the right side pain again (Feels like someone is stabbing her w/knife) that she reported at her visit on 08/23/20. States she was advised it may be how the baby is lying. She states the baby is on her left side and the she woke up again today with the right side pain. Cb#[phone]

## 2020-08-26 NOTE — Telephone Encounter (Signed)
Spoke w/patient. Denies ctx, lof, vb, urinary symptoms. Has tried changing positions, heating pad, tylenol w/o relief. States she is well hydrated. Recommended a belly band for support.

## 2020-08-30 NOTE — Telephone Encounter (Signed)
Pt was seen in the ED 08/26/20.

## 2020-09-01 ENCOUNTER — Encounter: Payer: Medicaid Other | Admitting: Obstetrics and Gynecology

## 2020-09-01 ENCOUNTER — Other Ambulatory Visit: Payer: Medicaid Other

## 2020-09-06 ENCOUNTER — Other Ambulatory Visit (INDEPENDENT_AMBULATORY_CARE_PROVIDER_SITE_OTHER): Payer: Medicaid Other

## 2020-09-06 ENCOUNTER — Ambulatory Visit (INDEPENDENT_AMBULATORY_CARE_PROVIDER_SITE_OTHER): Payer: Medicaid Other | Admitting: Obstetrics and Gynecology

## 2020-09-06 ENCOUNTER — Other Ambulatory Visit: Payer: Self-pay | Admitting: Obstetrics and Gynecology

## 2020-09-06 ENCOUNTER — Other Ambulatory Visit: Payer: Self-pay

## 2020-09-06 VITALS — BP 108/66 | Wt 139.0 lb

## 2020-09-06 DIAGNOSIS — Z3400 Encounter for supervision of normal first pregnancy, unspecified trimester: Secondary | ICD-10-CM

## 2020-09-06 DIAGNOSIS — O99013 Anemia complicating pregnancy, third trimester: Secondary | ICD-10-CM

## 2020-09-06 DIAGNOSIS — Z3689 Encounter for other specified antenatal screening: Secondary | ICD-10-CM

## 2020-09-06 DIAGNOSIS — O365930 Maternal care for other known or suspected poor fetal growth, third trimester, not applicable or unspecified: Secondary | ICD-10-CM

## 2020-09-06 DIAGNOSIS — R7309 Other abnormal glucose: Secondary | ICD-10-CM

## 2020-09-06 DIAGNOSIS — B009 Herpesviral infection, unspecified: Secondary | ICD-10-CM

## 2020-09-06 DIAGNOSIS — O98519 Other viral diseases complicating pregnancy, unspecified trimester: Secondary | ICD-10-CM

## 2020-09-06 DIAGNOSIS — Z3A32 32 weeks gestation of pregnancy: Secondary | ICD-10-CM

## 2020-09-06 NOTE — Progress Notes (Signed)
Routine Prenatal Care Visit  Subjective  Chelsea Andrade is a 19 y.o. G1P0 at [redacted]w[redacted]d being seen today for ongoing prenatal care.  She is currently monitored for the following issues for this high-risk pregnancy and has ASTHMA; CONTUSION OF FOOT; Drug overdose; Ingestion of unknown medication; Drug ingestion; MDD (major depressive disorder), recurrent severe, without psychosis (HCC); Generalized anxiety disorder; Attention deficit hyperactivity disorder (ADHD), combined type, moderate; Herpes simplex type 2 (HSV-2) infection affecting pregnancy, antepartum; Supervision of normal first pregnancy, antepartum; Poor weight gain of pregnancy, second trimester; Abnormal glucose tolerance test; and Anemia affecting pregnancy in third trimester on their problem list.  ----------------------------------------------------------------------------------- Patient reports no complaints.   Contractions: Not present. Vag. Bleeding: None.  Movement: Present. Denies leaking of fluid.  ----------------------------------------------------------------------------------- The following portions of the patient's history were reviewed and updated as appropriate: allergies, current medications, past family history, past medical history, past social history, past surgical history and problem list. Problem list updated.   Objective  Blood pressure 108/66, weight 139 lb (63 kg), last menstrual period 01/17/2020. Pregravid weight 120 lb (54.4 kg) Total Weight Gain 19 lb (8.618 kg) Urinalysis:      Fetal Status: Fetal Heart Rate (bpm): 130 Fundal Height: 30 cm Movement: Present  Presentation: Vertex  General:  Alert, oriented and cooperative. Patient is in no acute distress.  Skin: Skin is warm and dry. No rash noted.   Cardiovascular: Normal heart rate noted  Respiratory: Normal respiratory effort, no problems with respiration noted  Abdomen: Soft, gravid, appropriate for gestational age. Pain/Pressure: Absent       Pelvic:  Cervical exam deferred        Extremities: Normal range of motion.     ental Status: Normal mood and affect. Normal behavior. Normal judgment and thought content.   US OB Follow Up  Result Date: 09/06/2020 Patient Name: Chelsea Andrade DOB: 22-Aug-2001 MRN: [id number] ULTRASOUND REPORT Location: [facility] Date of Service: 09/06/2020 Indications:growth/afi Findings: Mason Jim intrauterine pregnancy is visualized with FHR at 144 BPM. Biometrics give an (U/S) Gestational age of [redacted]w[redacted]d and an (U/S) EDD of 10/28/2020; this correlates with the clinically established Estimated Date of Delivery: 10/23/20. Fetal presentation is Cephalic. Placenta: anterior. Grade: 2 AFI: 12.7 cm Growth percentile is 26.1%.  AC percentile is 5.4%. EFW: 1894 g  ( 4 lb 3 oz ) BPP Scoring: Movement: 2/2 Tone: 2/2 Breathing: 2/2 AFI: 2/2 Umbilical Artery Dopplers were performed due to [facility] being 5.4% Systolic and Diastolic blood flow in each umbilical artery appear normal and without reversal or absence of diastolic flow. The maximum S/D ratio is 2.98. According to perinatology.com, this ratio is normal for this gestational age. Impression: 1. [redacted]w[redacted]d Viable Singleton Intrauterine pregnancy previously established criteria. 2. Growth is 26.1% %ile. AC: 5.4% AFI is 12.7 cm. 3. BPP is 8/8. 4. Normal umbilical artery dopplers. Recommendations: 1.Clinical correlation with the patient's History and Physical Exam. Deanna Artis, RT There is a singleton gestation with normal amniotic fluid volume. The fetal composite fetal biometry correlates with established dating, however AC measures at 5.4%ile. AFI is normal at 12.7cm, BPP 8/8 with normal umbilical artery doppler today.  The visualized fetal anatomy appears within normal limits within the resolution of ultrasound as described above.  It must be noted that a normal ultrasound is unable to rule out fetal aneuploidy.  Vena Austria, MD, [facility], [facility] 09/06/2020, 5:09 PM   US FETAL BPP WO NON STRESS  Result Date: 09/06/2020 Patient Name:  Chelsea Andrade DOB: 2001-04-15 MRN: [id number] ULTRASOUND REPORT Location: [facility] Date of Service: 09/06/2020 Indications:growth/afi Findings: Mason Jim intrauterine pregnancy is visualized with FHR at 144 BPM. Biometrics give an (U/S) Gestational age of [redacted]w[redacted]d and an (U/S) EDD of 10/28/2020; this correlates with the clinically established Estimated Date of Delivery: 10/23/20. Fetal presentation is Cephalic. Placenta: anterior. Grade: 2 AFI: 12.7 cm Growth percentile is 26.1%.  AC percentile is 5.4%. EFW: 1894 g  ( 4 lb 3 oz ) BPP Scoring: Movement: 2/2 Tone: 2/2 Breathing: 2/2 AFI: 2/2 Umbilical Artery Dopplers were performed due to [facility] being 5.4% Systolic and Diastolic blood flow in each umbilical artery appear normal and without reversal or absence of diastolic flow. The maximum S/D ratio is 2.98. According to perinatology.com, this ratio is normal for this gestational age. Impression: 1. [redacted]w[redacted]d Viable Singleton Intrauterine pregnancy previously established criteria. 2. Growth is 26.1% %ile. AC: 5.4% AFI is 12.7 cm. 3. BPP is 8/8. 4. Normal umbilical artery dopplers. Recommendations: 1.Clinical correlation with the patient's History and Physical Exam. Deanna Artis, RT There is a singleton gestation with normal amniotic fluid volume. The fetal composite fetal biometry correlates with established dating, however AC measures at 5.4%ile. AFI is normal at 12.7cm, BPP 8/8 with normal umbilical artery doppler today.  The visualized fetal anatomy appears within normal limits within the resolution of ultrasound as described above.  It must be noted that a normal ultrasound is unable to rule out fetal aneuploidy.  Vena Austria, MD, [facility], [facility] 09/06/2020, 5:09 PM  [place] UA Cord Doppler  Result Date: 09/06/2020 Patient Name: Chelsea Andrade DOB: 03/14/2001 MRN: [id number]  ULTRASOUND REPORT Location: [facility] Date of Service: 09/06/2020 Indications:growth/afi Findings: Mason Jim intrauterine pregnancy is visualized with FHR at 144 BPM. Biometrics give an (U/S) Gestational age of [redacted]w[redacted]d and an (U/S) EDD of 10/28/2020; this correlates with the clinically established Estimated Date of Delivery: 10/23/20. Fetal presentation is Cephalic. Placenta: anterior. Grade: 2 AFI: 12.7 cm Growth percentile is 26.1%.  AC percentile is 5.4%. EFW: 1894 g  ( 4 lb 3 oz ) BPP Scoring: Movement: 2/2 Tone: 2/2 Breathing: 2/2 AFI: 2/2 Umbilical Artery Dopplers were performed due to [facility] being 5.4% Systolic and Diastolic blood flow in each umbilical artery appear normal and without reversal or absence of diastolic flow. The maximum S/D ratio is 2.98. According to perinatology.com, this ratio is normal for this gestational age. Impression: 1. [redacted]w[redacted]d Viable Singleton Intrauterine pregnancy previously established criteria. 2. Growth is 26.1% %ile. AC: 5.4% AFI is 12.7 cm. 3. BPP is 8/8. 4. Normal umbilical artery dopplers. Recommendations: 1.Clinical correlation with the patient's History and Physical Exam. Deanna Artis, RT There is a singleton gestation with normal amniotic fluid volume. The fetal composite fetal biometry correlates with established dating, however AC measures at 5.4%ile. AFI is normal at 12.7cm, BPP 8/8 with normal umbilical artery doppler today.  The visualized fetal anatomy appears within normal limits within the resolution of ultrasound as described above.  It must be noted that a normal ultrasound is unable to rule out fetal aneuploidy.  Vena Austria, MD, Evern Core [facility], [facility] 09/06/2020, 5:09 PM    Immunization History  Administered Date(s) Administered  . Tdap 08/09/2018     Assessment   19 y.o. G1P0 at [redacted]w[redacted]d by  10/23/2020, by Last Menstrual Period presenting for routine prenatal visit  Plan   pregnancy Problems (from 03/09/20 to  present)    Problem Noted Resolved  Herpes simplex type 2 (HSV-2) infection affecting pregnancy, antepartum 03/09/2020 by Vena Austria, MD No   Supervision of normal first pregnancy, antepartum 03/09/2020 by Vena Austria, MD No   Overview Addendum 08/19/2020  1:45 PM by Vena Austria, MD    Clinic [facility] Prenatal Labs  Dating By LMP c/w 7w u/s Blood type: A/Positive/-- (03/24 1434)   Genetic Screen MaterniT21 normal XY Antibody:Negative (03/24 1434)  Anatomic [place] Complete 6/23 Rubella: 10.50 (03/24 1434)  Varicella: immune  GTT 28 wk: 151 3-hr 76, 170, 75, 98 RPR: Non Reactive (03/24 1434)   Rhogam  not needed HBsAg: Negative (03/24 1434)   Vaccines TDAP:                       Flu Shot: HIV: Non Reactive (03/24 1434)   Baby Food                                GBS:   Contraception  Pap: under 21  CBB     CS/VBAC    Support Person           Previous Version       Gestational age appropriate obstetric precautions including but not limited to vaginal bleeding, contractions, leaking of fluid and fetal movement were reviewed in detail with the patient.    IUGR - EFW 4lbs 3oz (1894g) c/w 26.1% but AC 5.4%ile.  Normal dopplers and BPP 8/8 today - twice weekly APT with NST, once weekly AFI/Dopplers  Return in 3 days (on 09/09/2020) for ROB, NST.  Vena Austria, MD, [facility], [facility] 09/06/2020, 5:13 PM

## 2020-09-06 NOTE — Progress Notes (Signed)
ROB  Growth scan 

## 2020-09-09 ENCOUNTER — Ambulatory Visit (INDEPENDENT_AMBULATORY_CARE_PROVIDER_SITE_OTHER): Payer: Medicaid Other | Admitting: Obstetrics & Gynecology

## 2020-09-09 ENCOUNTER — Other Ambulatory Visit: Payer: Self-pay

## 2020-09-09 ENCOUNTER — Encounter: Payer: Self-pay | Admitting: Obstetrics & Gynecology

## 2020-09-09 VITALS — BP 120/80 | Wt 139.0 lb

## 2020-09-09 DIAGNOSIS — O365930 Maternal care for other known or suspected poor fetal growth, third trimester, not applicable or unspecified: Secondary | ICD-10-CM | POA: Diagnosis not present

## 2020-09-09 DIAGNOSIS — Z3403 Encounter for supervision of normal first pregnancy, third trimester: Secondary | ICD-10-CM

## 2020-09-09 DIAGNOSIS — Z3A33 33 weeks gestation of pregnancy: Secondary | ICD-10-CM

## 2020-09-09 LAB — FETAL NONSTRESS TEST

## 2020-09-09 NOTE — Progress Notes (Signed)
  Subjective  Fetal Movement? yes Contractions? no Leaking Fluid? no Vaginal Bleeding? no  Objective  BP 120/80   Wt 139 lb (63 kg)   LMP 01/17/2020 (Exact Date)   BMI 23.13 kg/m  General: NAD Pumonary: no increased work of breathing Abdomen: gravid, non-tender Extremities: no edema Psychiatric: mood appropriate, affect full  A NST procedure was performed with FHR monitoring and a normal baseline established, appropriate time of 20-40 minutes of evaluation, and accels >2 seen w 15x15 characteristics.  Results show a REACTIVE NST.   Assessment  19 y.o. G1P0 at [redacted]w[redacted]d by  10/23/2020, by Last Menstrual Period presenting for routine prenatal visit  Plan   Problem List Items Addressed This Visit      Other   Supervision of normal first pregnancy, antepartum   Poor fetal growth affecting management of mother in third trimester - Primary   Relevant Orders   US OB Limited   US UA Cord Doppler    Other Visit Diagnoses    [redacted] weeks gestation of pregnancy          pregnancy Problems (from 03/09/20 to present)    Problem Noted Resolved   Herpes simplex type 2 (HSV-2) infection affecting pregnancy, antepartum 03/09/2020 by Vena Austria, MD No   Supervision of normal first pregnancy, antepartum 03/09/2020 by Vena Austria, MD No   Overview Addendum 09/06/2020  5:15 PM by Vena Austria, MD    Clinic [facility] Prenatal Labs  Dating By LMP c/w 7w u/s Blood type: A/Positive/-- (03/24 1434)   Genetic Screen MaterniT21 normal XY Antibody:Negative (03/24 1434)  Anatomic [place] Complete 6/23 Rubella: 10.50 (03/24 1434)  Varicella: immune  GTT 28 wk: 151 3-hr 76, 170, 75, 98 RPR: Non Reactive (03/24 1434)   Rhogam  not needed HBsAg: Negative (03/24 1434)   Vaccines TDAP:                       Flu Shot: HIV: Non Reactive (03/24 1434)   Baby Food                                GBS:   Contraception  Pap: under 21  CBB     CS/VBAC    Support Person     IUGR - [redacted]w[redacted]d growth scan EFW  4lbs 3oz (1894g) c/w 26.1% but AC 5.4%ile.      Previous Version    APT twice weekly, w R NST today Monitor growth as well, constitutional vs IUGR PNV, FMC, PTL precautions discussed  Annamarie Major, MD, [facility], [facility] 09/09/2020  4:22 PM

## 2020-09-09 NOTE — Patient Instructions (Signed)
Nonstress Test A nonstress test is a procedure that is done during pregnancy in order to check the baby's heartbeat. The procedure can help show if the baby (fetus) is healthy. It is commonly done when:  The baby is past his or her due date.  The pregnancy is high risk.  The baby is moving less than normal.  The mother has lost a pregnancy in the past.  The health care provider suspects a problem with the baby's growth.  There is too much or too little amniotic fluid. The procedure is often done in the third trimester of pregnancy to find out if an early delivery is needed and whether such a delivery is safe. During a nonstress test, the baby's heartbeat is monitored when the baby is resting and when the baby is moving. If the baby is healthy, the heart rate will increase when he or she moves or kicks and will return to normal when he or she rests. Tell a health care provider about:  Any allergies you have.  Any medical conditions you have.  All medicines you are taking, including vitamins, herbs, eye drops, creams, and over-the-counter medicines. What are the risks? There are no risks to you or your baby from a nonstress test. This procedure should not be painful or uncomfortable. What happens before the procedure?  Eat a meal right before the test or as directed by your health care provider. Food may help encourage the baby to move.  Use the restroom right before the test. What happens during the procedure?  Two monitors will be placed on your abdomen. One will record the baby's heart rate and the other will record the contractions of your uterus.  You may be asked to lie down on your side or to sit upright.  You may be given a button to press when you feel your baby move.  Your health care provider will listen to your baby's heartbeat and recorded it. He or she may also watch your baby's heartbeat on a screen.  If the baby seems to be sleeping, you may be asked to drink  some juice or soda, eat a snack, or change positions. The procedure may vary among health care providers and hospitals. What happens after the procedure?  Your health care provider will discuss the test results with you and make recommendations for the future. Depending on the results, your health care provider may order additional tests or another course of action.  If your health care provider gave you any diet or activity instructions, make sure to follow them.  Keep all follow-up visits as told by your health care provider. This is important. Summary  A nonstress test is a procedure that is done during pregnancy in order to check the baby's heartbeat. The procedure can help show if the baby is healthy.  The procedure is often done in the third trimester of pregnancy to find out if an early delivery is needed and whether such a delivery is safe.  During a nonstress test, the baby's heartbeat is monitored when the baby is resting and when the baby is moving. If the baby is healthy, the heart rate will increase when he or she moves or kicks and will return to normal when he or she rests.  Your health care provider will discuss the test results with you and make recommendations for the future. This information is not intended to replace advice given to you by your health care provider. Make sure you discuss any   questions you have with your health care provider. Document Revised: 03/14/2017 Document Reviewed: 03/14/2017 Elsevier Patient Education  2020 [employer]  

## 2020-09-13 ENCOUNTER — Ambulatory Visit (INDEPENDENT_AMBULATORY_CARE_PROVIDER_SITE_OTHER): Payer: Medicaid Other | Admitting: Obstetrics & Gynecology

## 2020-09-13 ENCOUNTER — Other Ambulatory Visit: Payer: Self-pay

## 2020-09-13 ENCOUNTER — Encounter: Payer: Self-pay | Admitting: Obstetrics & Gynecology

## 2020-09-13 VITALS — BP 120/70 | Wt 139.0 lb

## 2020-09-13 DIAGNOSIS — M549 Dorsalgia, unspecified: Secondary | ICD-10-CM

## 2020-09-13 DIAGNOSIS — Z3A34 34 weeks gestation of pregnancy: Secondary | ICD-10-CM

## 2020-09-13 DIAGNOSIS — Z3403 Encounter for supervision of normal first pregnancy, third trimester: Secondary | ICD-10-CM

## 2020-09-13 DIAGNOSIS — O365930 Maternal care for other known or suspected poor fetal growth, third trimester, not applicable or unspecified: Secondary | ICD-10-CM

## 2020-09-13 DIAGNOSIS — O99891 Other specified diseases and conditions complicating pregnancy: Secondary | ICD-10-CM

## 2020-09-13 LAB — POCT URINALYSIS DIPSTICK
Bilirubin, UA: NEGATIVE
Blood, UA: NEGATIVE
Glucose, UA: NEGATIVE
Ketones, UA: NEGATIVE
Nitrite, UA: NEGATIVE
Protein, UA: NEGATIVE
Spec Grav, UA: 1.01 (ref 1.010–1.025)
Urobilinogen, UA: 0.2 E.U./dL
pH, UA: 5 (ref 5.0–8.0)

## 2020-09-13 MED ORDER — NITROFURANTOIN MONOHYD MACRO 100 MG PO CAPS: 100.0000 mg | ORAL_CAPSULE | Freq: Two times a day (BID) | ORAL | 0 refills | Status: DC

## 2020-09-13 NOTE — Progress Notes (Signed)
  Subjective  Fetal Movement? yes Contractions? no Leaking Fluid? no Vaginal Bleeding? no Back pain that is moving to different areas, no fever Objective  BP 120/70   Wt 139 lb (63 kg)   LMP 01/17/2020 (Exact Date)   BMI 23.13 kg/m  General: NAD Pumonary: no increased work of breathing Abdomen: gravid, non-tender Extremities: no edema Psychiatric: mood appropriate, affect full UA POS LEUK Assessment  19 y.o. G1P0 at [redacted]w[redacted]d by  10/23/2020, by Last Menstrual Period presenting for routine prenatal visit  Plan   Problem List Items Addressed This Visit      Other   Poor fetal growth affecting management of mother in third trimester    Other Visit Diagnoses    Back pain affecting pregnancy in second trimester    -  Primary   Relevant Orders   POCT urinalysis dipstick (Completed)   Encounter for supervision of normal first pregnancy in third trimester       [redacted] weeks gestation of pregnancy        PNV, FMC NST bi-weekly, AFI weekly Monitor for [facility] Treat UTI w MACROBID    pregnancy Problems (from 03/09/20 to present)    Problem Noted Resolved   Herpes simplex type 2 (HSV-2) infection affecting pregnancy, antepartum     Supervision of normal first pregnancy, antepartum     Overview Addendum 09/06/2020  5:15 PM by Vena Austria, MD    [facility]inic [facility] Prenatal Labs  Dating By LMP c/w 7w u/s Blood type: A/Positive/-- (03/24 1434)   Genetic Screen MaterniT21 normal XY Antibody:Negative (03/24 1434)  Anatomic [place] Complete 6/23 Rubella: 10.50 (03/24 1434)  Varicella: immune  GTT 28 wk: 151 3-hr 76, 170, 75, 98 RPR: Non Reactive (03/24 1434)   Rhogam  not needed HBsAg: Negative (03/24 1434)   Vaccines TDAP:                       Flu Shot: HIV: Non Reactive (03/24 1434)   Baby Food                 BREAST               GBS:   Contraception   UNSURE Pap: under 21   IUGR - [redacted]w[redacted]d growth scan EFW 4lbs 3oz (1894g) c/w 26.1% but AC 5.4%ile.        Annamarie Major, MD,  [facility], [facility] 09/13/2020  3:33 PM

## 2020-09-16 ENCOUNTER — Ambulatory Visit (INDEPENDENT_AMBULATORY_CARE_PROVIDER_SITE_OTHER): Payer: Medicaid Other

## 2020-09-16 ENCOUNTER — Other Ambulatory Visit: Payer: Self-pay

## 2020-09-16 ENCOUNTER — Encounter: Payer: Medicaid Other | Admitting: Obstetrics and Gynecology

## 2020-09-16 DIAGNOSIS — O365930 Maternal care for other known or suspected poor fetal growth, third trimester, not applicable or unspecified: Secondary | ICD-10-CM | POA: Diagnosis not present

## 2020-09-20 ENCOUNTER — Encounter: Payer: Self-pay | Admitting: Obstetrics & Gynecology

## 2020-09-20 ENCOUNTER — Ambulatory Visit (INDEPENDENT_AMBULATORY_CARE_PROVIDER_SITE_OTHER): Payer: Medicaid Other | Admitting: Obstetrics & Gynecology

## 2020-09-20 ENCOUNTER — Other Ambulatory Visit: Payer: Self-pay

## 2020-09-20 VITALS — BP 106/68 | Wt 139.0 lb

## 2020-09-20 DIAGNOSIS — Z3400 Encounter for supervision of normal first pregnancy, unspecified trimester: Secondary | ICD-10-CM

## 2020-09-20 DIAGNOSIS — O365930 Maternal care for other known or suspected poor fetal growth, third trimester, not applicable or unspecified: Secondary | ICD-10-CM | POA: Diagnosis not present

## 2020-09-20 DIAGNOSIS — Z3403 Encounter for supervision of normal first pregnancy, third trimester: Secondary | ICD-10-CM

## 2020-09-20 DIAGNOSIS — Z3A35 35 weeks gestation of pregnancy: Secondary | ICD-10-CM

## 2020-09-20 LAB — POCT URINALYSIS DIPSTICK OB
Glucose, UA: NEGATIVE
POC,PROTEIN,UA: NEGATIVE

## 2020-09-20 NOTE — Patient Instructions (Signed)
Thank you for choosing [facility]. As part of our ongoing efforts to improve patient experience, we would appreciate your feedback. Please fill out the short survey that you will receive by mail or MyChart. Your opinion is important to us! -Dr Cashmere Dingley  

## 2020-09-20 NOTE — Progress Notes (Signed)
°  Subjective  Fetal Movement? yes Contractions? no Leaking Fluid? no Vaginal Bleeding? no  Objective  BP 106/68    Wt 139 lb (63 kg)    LMP 01/17/2020 (Exact Date)    BMI 23.13 kg/m  General: NAD Pumonary: no increased work of breathing Abdomen: gravid, non-tender Extremities: no edema Psychiatric: mood appropriate, affect full  A NST procedure was performed with FHR monitoring and a normal baseline established, appropriate time of 20-40 minutes of evaluation, and accels >2 seen w 15x15 characteristics.  Results show a REACTIVE NST.   Assessment  19 y.o. G1P0 at [redacted]w[redacted]d by  10/23/2020, by Last Menstrual Period presenting for routine prenatal visit  Plan   Problem List Items Addressed This Visit    Poor fetal growth affecting management of mother in third trimester   Relevant Orders   US OB Follow Up - weekly AFI, Biweekly NST   [redacted] weeks gestation of pregnancy       PNV, FMC, PTL precautions   Relevant Orders   POC Urinalysis Dipstick OB (Completed)   Encounter for supervision of normal first pregnancy in third trimester          pregnancy Problems (from 03/09/20 to present)    Problem Noted Resolved   Herpes simplex type 2 (HSV-2) infection affecting pregnancy, antepartum     Supervision of normal first pregnancy, antepartum     Overview Addendum 09/06/2020  5:15 PM by Vena Austria, MD    Clinic [facility] Prenatal Labs  Dating By LMP c/w 7w u/s Blood type: A/Positive/-- (03/24 1434)   Genetic Screen MaterniT21 normal XY Antibody:Negative (03/24 1434)  Anatomic [place] Complete 6/23 Rubella: 10.50 (03/24 1434)  Varicella: immune  GTT 28 wk: 151 3-hr 76, 170, 75, 98 RPR: Non Reactive (03/24 1434)   Rhogam  not needed HBsAg: Negative (03/24 1434)   Vaccines TDAP:                       Flu Shot: HIV: Non Reactive (03/24 1434)   Baby Food                                GBS:   Contraception  Pap: under 21  CBB  no   CS/VBAC no   Support Person     IUGR - [redacted]w[redacted]d growth scan  EFW 4lbs 3oz (1894g) c/w 26.1% but AC 5.4%ile.        Annamarie Major, MD, [facility], [facility] 09/20/2020  2:58 PM

## 2020-09-23 ENCOUNTER — Other Ambulatory Visit: Payer: Self-pay | Admitting: Obstetrics and Gynecology

## 2020-09-23 ENCOUNTER — Ambulatory Visit (INDEPENDENT_AMBULATORY_CARE_PROVIDER_SITE_OTHER): Payer: Medicaid Other | Admitting: Obstetrics and Gynecology

## 2020-09-23 ENCOUNTER — Other Ambulatory Visit: Payer: Self-pay | Admitting: Obstetrics & Gynecology

## 2020-09-23 ENCOUNTER — Other Ambulatory Visit: Payer: Self-pay

## 2020-09-23 ENCOUNTER — Ambulatory Visit (INDEPENDENT_AMBULATORY_CARE_PROVIDER_SITE_OTHER): Payer: Medicaid Other

## 2020-09-23 VITALS — BP 112/74 | Wt 141.0 lb

## 2020-09-23 DIAGNOSIS — O98519 Other viral diseases complicating pregnancy, unspecified trimester: Secondary | ICD-10-CM | POA: Diagnosis not present

## 2020-09-23 DIAGNOSIS — O99013 Anemia complicating pregnancy, third trimester: Secondary | ICD-10-CM

## 2020-09-23 DIAGNOSIS — O365930 Maternal care for other known or suspected poor fetal growth, third trimester, not applicable or unspecified: Secondary | ICD-10-CM | POA: Diagnosis not present

## 2020-09-23 DIAGNOSIS — B009 Herpesviral infection, unspecified: Secondary | ICD-10-CM

## 2020-09-23 DIAGNOSIS — Z3400 Encounter for supervision of normal first pregnancy, unspecified trimester: Secondary | ICD-10-CM

## 2020-09-23 DIAGNOSIS — Z3A35 35 weeks gestation of pregnancy: Secondary | ICD-10-CM

## 2020-09-23 DIAGNOSIS — R7309 Other abnormal glucose: Secondary | ICD-10-CM

## 2020-09-23 LAB — FETAL NONSTRESS TEST

## 2020-09-23 LAB — POCT URINALYSIS DIPSTICK OB
Glucose, UA: NEGATIVE
POC,PROTEIN,UA: NEGATIVE

## 2020-09-23 MED ORDER — VALACYCLOVIR HCL 500 MG PO TABS: 500.0000 mg | ORAL_TABLET | Freq: Two times a day (BID) | ORAL | 6 refills | Status: DC

## 2020-09-23 NOTE — Progress Notes (Signed)
ROB AFI/Dopplers/NST

## 2020-09-23 NOTE — Progress Notes (Signed)
Routine Prenatal Care Visit  Subjective  Chelsea Andrade is a 19 y.o. G1P0 at [redacted]w[redacted]d being seen today for ongoing prenatal care.  She is currently monitored for the following issues for this high-risk pregnancy and has ASTHMA; CONTUSION OF FOOT; Drug overdose; Ingestion of unknown medication; Drug ingestion; MDD (major depressive disorder), recurrent severe, without psychosis (HCC); Generalized anxiety disorder; Attention deficit hyperactivity disorder (ADHD), combined type, moderate; Herpes simplex type 2 (HSV-2) infection affecting pregnancy, antepartum; Supervision of normal first pregnancy, antepartum; Poor weight gain of pregnancy, second trimester; Abnormal glucose tolerance test; Anemia affecting pregnancy in third trimester; and Poor fetal growth affecting management of mother in third trimester on their problem list.  ----------------------------------------------------------------------------------- Patient reports no complaints.   Contractions: Not present. Vag. Bleeding: None.  Movement: Present. Denies leaking of fluid.  ----------------------------------------------------------------------------------- The following portions of the patient's history were reviewed and updated as appropriate: allergies, current medications, past family history, past medical history, past social history, past surgical history and problem list. Problem list updated.   Objective  Blood pressure 112/74, weight 141 lb (64 kg), last menstrual period 01/17/2020. Pregravid weight 120 lb (54.4 kg) Total Weight Gain 21 lb (9.526 kg) Urinalysis:      Fetal Status: Fetal Heart Rate (bpm): 130   Movement: Present  Presentation: Vertex  General:  Alert, oriented and cooperative. Patient is in no acute distress.  Skin: Skin is warm and dry. No rash noted.   Cardiovascular: Normal heart rate noted  Respiratory: Normal respiratory effort, no problems with respiration noted  Abdomen: Soft, gravid, appropriate  for gestational age. Pain/Pressure: Absent     Pelvic:  Cervical exam deferred        Extremities: Normal range of motion.     ental Status: Normal mood and affect. Normal behavior. Normal judgment and thought content.   Baseline: 130 Variability: moderate Accelerations: present Decelerations: absent Tocometry: N/A The patient was monitored for 30 minutes, fetal heart rate tracing was deemed reactive, category I tracing,  CPT 59025   US OB Follow Up  Result Date: 09/06/2020 Patient Name: Chelsea Andrade DOB: 12-22-2000 MRN: [id number] ULTRASOUND REPORT Location: [facility] Date of Service: 09/06/2020 Indications:growth/afi Findings: Mason Jim intrauterine pregnancy is visualized with FHR at 144 BPM. Biometrics give an (U/S) Gestational age of [redacted]w[redacted]d and an (U/S) EDD of 10/28/2020; this correlates with the clinically established Estimated Date of Delivery: 10/23/20. Fetal presentation is Cephalic. Placenta: anterior. Grade: 2 AFI: 12.7 cm Growth percentile is 26.1%.  AC percentile is 5.4%. EFW: 1894 g  ( 4 lb 3 oz ) BPP Scoring: Movement: 2/2 Tone: 2/2 Breathing: 2/2 AFI: 2/2 Umbilical Artery Dopplers were performed due to [facility] being 5.4% Systolic and Diastolic blood flow in each umbilical artery appear normal and without reversal or absence of diastolic flow. The maximum S/D ratio is 2.98. According to perinatology.com, this ratio is normal for this gestational age. Impression: 1. [redacted]w[redacted]d Viable Singleton Intrauterine pregnancy previously established criteria. 2. Growth is 26.1% %ile. AC: 5.4% AFI is 12.7 cm. 3. BPP is 8/8. 4. Normal umbilical artery dopplers. Recommendations: 1.Clinical correlation with the patient's History and Physical Exam. Deanna Artis, RT There is a singleton gestation with normal amniotic fluid volume. The fetal composite fetal biometry correlates with established dating, however AC measures at 5.4%ile. AFI is normal at 12.7cm, BPP 8/8 with normal umbilical artery doppler  today.  The visualized fetal anatomy appears within normal limits within the resolution of ultrasound as described above.  It must be noted  that a normal ultrasound is unable to rule out fetal aneuploidy.  Vena Austria, MD, [facility], [facility] 09/06/2020, 5:09 PM   US FETAL BPP WO NON STRESS  Result Date: 09/06/2020 Patient Name: Chelsea Andrade DOB: [birth year] MRN: [id number] ULTRASOUND REPORT Location: [facility] Date of Service: 09/06/2020 Indications:growth/afi Findings: Mason Jim intrauterine pregnancy is visualized with FHR at 144 BPM. Biometrics give an (U/S) Gestational age of [redacted]w[redacted]d and an (U/S) EDD of 10/28/2020; this correlates with the clinically established Estimated Date of Delivery: 10/23/20. Fetal presentation is Cephalic. Placenta: anterior. Grade: 2 AFI: 12.7 cm Growth percentile is 26.1%.  AC percentile is 5.4%. EFW: 1894 g  ( 4 lb 3 oz ) BPP Scoring: Movement: 2/2 Tone: 2/2 Breathing: 2/2 AFI: 2/2 Umbilical Artery Dopplers were performed due to [facility] being 5.4% Systolic and Diastolic blood flow in each umbilical artery appear normal and without reversal or absence of diastolic flow. The maximum S/D ratio is 2.98. According to perinatology.com, this ratio is normal for this gestational age. Impression: 1. [redacted]w[redacted]d Viable Singleton Intrauterine pregnancy previously established criteria. 2. Growth is 26.1% %ile. AC: 5.4% AFI is 12.7 cm. 3. BPP is 8/8. 4. Normal umbilical artery dopplers. Recommendations: 1.Clinical correlation with the patient's History and Physical Exam. Deanna Artis, RT There is a singleton gestation with normal amniotic fluid volume. The fetal composite fetal biometry correlates with established dating, however AC measures at 5.4%ile. AFI is normal at 12.7cm, BPP 8/8 with normal umbilical artery doppler today.  The visualized fetal anatomy appears within normal limits within the resolution of ultrasound as described above.  It must be noted  that a normal ultrasound is unable to rule out fetal aneuploidy.  Vena Austria, MD, [facility], [facility] 09/06/2020, 5:09 PM  [place] UA Cord Doppler  Result Date: 09/23/2020 Patient Name: Chelsea Andrade DOB: [birth year] MRN: [id number] ULTRASOUND REPORT Location: [facility] Date of Service: 09/23/2020 Indications: Biophysical Profile performed for indication of IUGR Findings: Singleton intrauterine pregnancy is visualized with FHR at 126 BPM.   Fetal presentation is Cephalic. Placenta: anterior. Grade: 2 AFI: 6.5 cm BPP Scoring: Movement: 2/2 Tone: 2/2 Breathing: 0/2 AFI: 2/2 Umbilical Artery Dopplers were performed due to IUGR. Systolic and Diastolic blood flow in each umbilical artery appear normal and without reversal or absence of diastolic flow. The maximum S/D ratio is 3.20. According to perinatology.com, this ratio is slightly high for this gestational age. The other two doppler S/D ratio's were 3.10 and 2.77. Impression: 1. [redacted]w[redacted]d Viable Singleton Intrauterine pregnancy dated by previously established criteria. 2. AFI is 6.5 cm. 3. BPP is 6/8. No practice breathing. 4. Two normal umbilical artery dopplers and one slightly high. Recommendations: 1.Clinical correlation with the patient's History and Physical Exam. Deanna Artis, RT There is a singleton gestation with normal amniotic fluid volume. BPP 6/8 and SD ratio with preserved end diastolic flow but slightly increased.  The visualized fetal anatomy appears within normal limits within the resolution of ultrasound as described above.  It must be noted that a normal ultrasound is unable to rule out fetal aneuploidy.  Vena Austria, MD, [facility], [facility] 09/23/2020, 10:19 AM   [place] UA Cord Doppler  Result Date: 09/16/2020 Patient Name: Chelsea Andrade DOB: 13-Aug-2001 MRN: [id number] ULTRASOUND REPORT Location: [facility] Date of Service: 09/16/2020 Indications: Intrauterine growth  restriction, umbilical artery dopplers. Findings: Singleton intrauterine pregnancy is visualized with FHR at 152 BPM. Fetal presentation is Transverse. Placenta: anterior. Grade:  2 AFI: subjectively normal. Umbilical Artery Dopplers were performed due to poor fetal growth, fetal growth restriction. Systolic and Diastolic blood flow in each umbilical artery appear normal and without reversal or absence of diastolic flow. The maximum S/D ratio is 3.52. According to perinatology.com, this ratio is normal for this gestational age. Impression: 1. No AFI performed. However, amniotic fluid subjectively normal. 2. Normal umbilical artery S/D ratio given gestational age. 3. Transverse fetal lie. Recommendations: 1. Perform AFI in one week 2. Repeat umbilical artery dopplers as indicated. Jenine M. Marciano Sequin    RDMS The ultrasound images and findings were reviewed by me and I agree with the above report. Thomasene Mohair, MD, [facility], [facility] 09/16/2020 9:20 PM     [place] UA Cord Doppler  Result Date: 09/06/2020 Patient Name: Chelsea Andrade DOB: [birth year] MRN: [id number] ULTRASOUND REPORT Location: [facility] Date of Service: 09/06/2020 Indications:growth/afi Findings: Mason Jim intrauterine pregnancy is visualized with FHR at 144 BPM. Biometrics give an (U/S) Gestational age of [redacted]w[redacted]d and an (U/S) EDD of 10/28/2020; this correlates with the clinically established Estimated Date of Delivery: 10/23/20. Fetal presentation is Cephalic. Placenta: anterior. Grade: 2 AFI: 12.7 cm Growth percentile is 26.1%.  AC percentile is 5.4%. EFW: 1894 g  ( 4 lb 3 oz ) BPP Scoring: Movement: 2/2 Tone: 2/2 Breathing: 2/2 AFI: 2/2 Umbilical Artery Dopplers were performed due to [facility] being 5.4% Systolic and Diastolic blood flow in each umbilical artery appear normal and without reversal or absence of diastolic flow. The maximum S/D ratio is 2.98. According to perinatology.com, this ratio is normal for this gestational  age. Impression: 1. [redacted]w[redacted]d Viable Singleton Intrauterine pregnancy previously established criteria. 2. Growth is 26.1% %ile. AC: 5.4% AFI is 12.7 cm. 3. BPP is 8/8. 4. Normal umbilical artery dopplers. Recommendations: 1.Clinical correlation with the patient's History and Physical Exam. Deanna Artis, RT There is a singleton gestation with normal amniotic fluid volume. The fetal composite fetal biometry correlates with established dating, however AC measures at 5.4%ile. AFI is normal at 12.7cm, BPP 8/8 with normal umbilical artery doppler today.  The visualized fetal anatomy appears within normal limits within the resolution of ultrasound as described above.  It must be noted that a normal ultrasound is unable to rule out fetal aneuploidy.  Vena Austria, MD, [facility], [facility] 09/06/2020, 5:09 PM  US FETAL BPP W/NONSTRESS  Result Date: 09/23/2020 Patient Name: Chelsea Andrade DOB: 2001/11/28 MRN: [id number] ULTRASOUND REPORT Location: [facility] Date of Service: 09/23/2020 Indications: Biophysical Profile performed for indication of IUGR Findings: Singleton intrauterine pregnancy is visualized with FHR at 126 BPM.   Fetal presentation is Cephalic. Placenta: anterior. Grade: 2 AFI: 6.5 cm BPP Scoring: Movement: 2/2 Tone: 2/2 Breathing: 0/2 AFI: 2/2 Umbilical Artery Dopplers were performed due to IUGR. Systolic and Diastolic blood flow in each umbilical artery appear normal and without reversal or absence of diastolic flow. The maximum S/D ratio is 3.20. According to perinatology.com, this ratio is slightly high for this gestational age. The other two doppler S/D ratio's were 3.10 and 2.77. Impression: 1. [redacted]w[redacted]d Viable Singleton Intrauterine pregnancy dated by previously established criteria. 2. AFI is 6.5 cm. 3. BPP is 6/8. No practice breathing. 4. Two normal umbilical artery dopplers and one slightly high. Recommendations: 1.Clinical correlation with the patient's History  and Physical Exam. Deanna Artis, RT There is a singleton gestation with normal amniotic fluid volume. BPP 6/8 and SD ratio with preserved end diastolic flow but  slightly increased.  The visualized fetal anatomy appears within normal limits within the resolution of ultrasound as described above.  It must be noted that a normal ultrasound is unable to rule out fetal aneuploidy.  Vena AustriaAndreas Balin Vandegrift, MD, Evern CoreFACOG [facility], [facility] 09/23/2020, 10:19 AM     Assessment   19 y.o. G1P0 at 6035w5d by  10/23/2020, by Last Menstrual Period presenting for routine prenatal visit  Plan   pregnancy Problems (from 03/09/20 to present)    Problem Noted Resolved   Poor fetal growth affecting management of mother in third trimester 09/06/2020 by Vena AustriaStaebler, Parmvir Boomer, MD No   Overview Signed 09/06/2020  5:14 PM by Vena AustriaStaebler, Ronita Hargreaves, MD    32 week growth scan with IUGR [facility]      Herpes simplex type 2 (HSV-2) infection affecting pregnancy, antepartum 03/09/2020 by Vena AustriaStaebler, Vitaly Wanat, MD No   Supervision of normal first pregnancy, antepartum 03/09/2020 by Vena AustriaStaebler, Ahliyah Nienow, MD No   Overview Addendum 09/20/2020  3:03 PM by Nadara MustardHarris, Robert P, MD    Clinic [facility] Prenatal Labs  Dating By LMP c/w 7w u/s Blood type: A/Positive/-- (03/24 1434)   Genetic Screen MaterniT21 normal XY Antibody:Negative (03/24 1434)  Anatomic US Complete 6/23 Rubella: 10.50 (03/24 1434)  Varicella: immune  GTT 28 wk: 151 3-hr 76, 170, 75, 98 RPR: Non Reactive (03/24 1434)   Rhogam  not needed HBsAg: Negative (03/24 1434)   Vaccines TDAP:not done                     Flu Shot:declined HIV: Non Reactive (03/24 1434)   Baby Food                                GBS:   Contraception  Pap: under 21  HSV +2020  Valtrex 36 weeks   CS/VBAC no   Support Person     IUGR - 547w2d growth scan EFW 4lbs 3oz (1894g) c/w 26.1% but AC 5.4%ile.      Previous Version       Gestational age appropriate obstetric  precautions including but not limited to vaginal bleeding, contractions, leaking of fluid and fetal movement were reviewed in detail with the patient.    1) IUGR - growth scan next week - NST reactive today - BPP 6/8 (no fetal breathing) - continue twice weekly APT  2) HSV  - Rx valtrex called in  Return in about 4 days (around 09/27/2020) for ROB, NST 09/27/2020, ROB, NST, growth scan and doppler 09/30/2020.  Vena AustriaAndreas Derita Michelsen, MD, Evern CoreFACOG [facility], [facility] 09/23/2020, 10:33 AM

## 2020-09-27 ENCOUNTER — Other Ambulatory Visit: Payer: Self-pay

## 2020-09-27 ENCOUNTER — Other Ambulatory Visit (HOSPITAL_COMMUNITY)
Admission: RE | Admit: 2020-09-27 | Discharge: 2020-09-27 | Disposition: A | Discharge status: Home / Self Care | Payer: Medicaid Other | Source: Ambulatory Visit | Attending: Obstetrics & Gynecology | Admitting: Obstetrics & Gynecology

## 2020-09-27 ENCOUNTER — Encounter: Payer: Self-pay | Admitting: Obstetrics & Gynecology

## 2020-09-27 ENCOUNTER — Ambulatory Visit (INDEPENDENT_AMBULATORY_CARE_PROVIDER_SITE_OTHER): Payer: Medicaid Other | Admitting: Obstetrics & Gynecology

## 2020-09-27 VITALS — BP 100/60 | Wt 140.0 lb

## 2020-09-27 DIAGNOSIS — Z113 Encounter for screening for infections with a predominantly sexual mode of transmission: Secondary | ICD-10-CM | POA: Insufficient documentation

## 2020-09-27 DIAGNOSIS — Z3A36 36 weeks gestation of pregnancy: Secondary | ICD-10-CM

## 2020-09-27 DIAGNOSIS — O365930 Maternal care for other known or suspected poor fetal growth, third trimester, not applicable or unspecified: Secondary | ICD-10-CM

## 2020-09-27 DIAGNOSIS — Z112 Encounter for screening for other bacterial diseases: Secondary | ICD-10-CM

## 2020-09-27 DIAGNOSIS — Z3403 Encounter for supervision of normal first pregnancy, third trimester: Secondary | ICD-10-CM

## 2020-09-27 LAB — FETAL NONSTRESS TEST

## 2020-09-27 NOTE — Patient Instructions (Signed)

## 2020-09-27 NOTE — Progress Notes (Signed)
°  [facility] INDUCTION ASSESSMENT SCHEDULING Chelsea Andrade Apr 04, 2001 Medical record #: [id number] Phone #:  Home Phone [phone]  Mobile [phone]    Prenatal Provider:[facility] Proposed admission date/time:10/05/2020, 0500 Method of induction:Cytotec  Weight: Filed Weights10/12/21 0954Weight:140 lb (63.5 kg) BMI Body mass index is 23.3 kg/m. HIV Negative HSV Negative EDC Estimated Date of Delivery: 11/7/21based on:LMP  Gestational age on admission: 37 Gravidity/parity:G1P0  Cervix Score   0 1 2 3   Position Posterior Midposition Anterior   Consistency Firm Medium Soft   Effacement (%) 0-30 40-50 60-70 >80  Dilation (cm) Closed 1-2 3-4 >5  Babys station -3 -2 -1 +1, +2   Bishop Score:5   Medical induction of labor  select indication(s) below Elective induction ?39 weeks multiparous patient ?39 weeks primiparous patient with Bishop score ?7 ?40 weeks primiparous patient   Medical Indications Adapted from ACOG Committee Opinion #[id number], Medically Indicated Late Preterm and Early Term Deliveries, 2013.  PLACENTAL / UTERINE ISSUES FETAL ISSUES MATERNAL ISSUES  ?   ?   ?  ? Growth Restriction 2014) ?   ?  ?  ?   ?  ? Concurrent findings (34.0-37.6) ?   ?  ?  ?   ?  ?  ?   OBSTETRIC ISSUES ?  ?   ?  ?    ?  ?  ?   ?  ?  ?   ?  ?  ?   ? Oligohydramnios (36.0-37.6); MVP <2 cm    Provider Signature: 03-05-1988 Scheduled Chelsea Andrade  Date:09/27/2020 10:44 AM   Call [phone] to finalize the induction date/time  [phone] (07/17)

## 2020-09-27 NOTE — Progress Notes (Signed)
°  Subjective  Fetal Movement? yes Contractions? no Leaking Fluid? no Vaginal Bleeding? no  Objective  BP 100/60    Wt 140 lb (63.5 kg)    LMP 01/17/2020 (Exact Date)    BMI 23.30 kg/m  General: NAD Pumonary: no increased work of breathing Abdomen: gravid, non-tender Extremities: no edema Psychiatric: mood appropriate, affect full  A NST procedure was performed with FHR monitoring and a normal baseline established, appropriate time of 20-40 minutes of evaluation, and accels >2 seen w 15x15 characteristics.  Results show a REACTIVE NST.   Assessment  19 y.o. G1P0 at [redacted]w[redacted]d by  10/23/2020, by Last Menstrual Period presenting for routine prenatal visit  Plan   Problem List Items Addressed This Visit      Other   Supervision of normal first pregnancy, antepartum   Poor fetal growth affecting management of mother in third trimester    Other Visit Diagnoses    [redacted] weeks gestation of pregnancy    -  Primary   Screening for streptococcal infection       Relevant Orders   Culture, beta strep (group b only)   Screen for STD (sexually transmitted disease)       Relevant Orders   GC/Chlamydia probe amp ([place])not at [facility] Prenatal Labs  Dating By LMP c/w 7w u/s Blood type: A/Positive/-- (03/24 1434)   Genetic Screen MaterniT21 normal XY Antibody:Negative (03/24 1434)  Anatomic [place] Complete 6/23 Rubella: 10.50 (03/24 1434)  Varicella: immune  GTT 28 wk: 151 3-hr 76, 170, 75, 98 RPR: Non Reactive (03/24 1434)   Rhogam  not needed HBsAg: Negative (03/24 1434)   Vaccines TDAP:not done                     Flu Shot:declined HIV: Non Reactive (03/24 1434)   Baby Food                   Breast             GBS: today  Contraception Unsure Pap: under 21  HSV +2020  Valtrex 36 weeks   CS/VBAC no   Support Person          APT twice weekly IOL 37 weeks discussed, pros and cons    Plan 10/05/2020  Annamarie Major, MD, [facility], [facility] 09/27/2020  10:43 AM

## 2020-09-28 LAB — GC/CHLAMYDIA PROBE AMP (~~LOC~~) NOT AT ARMC
Chlamydia: NEGATIVE
Comment: NEGATIVE
Comment: NORMAL
Neisseria Gonorrhea: NEGATIVE

## 2020-09-30 ENCOUNTER — Other Ambulatory Visit: Payer: Self-pay

## 2020-09-30 ENCOUNTER — Ambulatory Visit (INDEPENDENT_AMBULATORY_CARE_PROVIDER_SITE_OTHER): Payer: Medicaid Other | Admitting: Obstetrics and Gynecology

## 2020-09-30 ENCOUNTER — Ambulatory Visit: Payer: Medicaid Other

## 2020-09-30 VITALS — BP 90/60 | Ht 65.0 in | Wt 141.8 lb

## 2020-09-30 DIAGNOSIS — O2613 Low weight gain in pregnancy, third trimester: Secondary | ICD-10-CM | POA: Diagnosis not present

## 2020-09-30 DIAGNOSIS — B009 Herpesviral infection, unspecified: Secondary | ICD-10-CM | POA: Diagnosis not present

## 2020-09-30 DIAGNOSIS — O98513 Other viral diseases complicating pregnancy, third trimester: Secondary | ICD-10-CM | POA: Diagnosis not present

## 2020-09-30 DIAGNOSIS — O365930 Maternal care for other known or suspected poor fetal growth, third trimester, not applicable or unspecified: Secondary | ICD-10-CM

## 2020-09-30 DIAGNOSIS — Z3A36 36 weeks gestation of pregnancy: Secondary | ICD-10-CM | POA: Diagnosis not present

## 2020-09-30 DIAGNOSIS — O2612 Low weight gain in pregnancy, second trimester: Secondary | ICD-10-CM

## 2020-09-30 DIAGNOSIS — Z3400 Encounter for supervision of normal first pregnancy, unspecified trimester: Secondary | ICD-10-CM

## 2020-09-30 DIAGNOSIS — Z3A35 35 weeks gestation of pregnancy: Secondary | ICD-10-CM

## 2020-09-30 DIAGNOSIS — O98519 Other viral diseases complicating pregnancy, unspecified trimester: Secondary | ICD-10-CM

## 2020-09-30 LAB — POCT URINALYSIS DIPSTICK OB
Glucose, UA: NEGATIVE
POC,PROTEIN,UA: NEGATIVE

## 2020-09-30 LAB — FETAL NONSTRESS TEST

## 2020-09-30 NOTE — Addendum Note (Signed)
Addended by: Lorrene Reid on: 09/30/2020 10:25 AM   Modules accepted: Kipp Brood

## 2020-09-30 NOTE — Progress Notes (Signed)
Routine Prenatal Care Visit  Subjective  Chelsea Andrade is a 19 y.o. G1P0 at [redacted]w[redacted]d being seen today for ongoing prenatal care.  She is currently monitored for the following issues for this high-risk pregnancy and has ASTHMA; CONTUSION OF FOOT; Drug overdose; Ingestion of unknown medication; Drug ingestion; MDD (major depressive disorder), recurrent severe, without psychosis (HCC); Generalized anxiety disorder; Attention deficit hyperactivity disorder (ADHD), combined type, moderate; Herpes simplex type 2 (HSV-2) infection affecting pregnancy, antepartum; Supervision of normal first pregnancy, antepartum; Poor weight gain of pregnancy, second trimester; Abnormal glucose tolerance test; Anemia affecting pregnancy in third trimester; and Poor fetal growth affecting management of mother in third trimester on their problem list.  ----------------------------------------------------------------------------------- Patient reports no complaints.   Contractions: Irregular. Vag. Bleeding: None.  Movement: Present. Denies leaking of fluid.  ----------------------------------------------------------------------------------- The following portions of the patient's history were reviewed and updated as appropriate: allergies, current medications, past family history, past medical history, past social history, past surgical history and problem list. Problem list updated.   Objective  Blood pressure 90/60, height 5\' 5"  (1.651 m), weight 141 lb 12.8 oz (64.3 kg), last menstrual period 01/17/2020. Pregravid weight 120 lb (54.4 kg) Total Weight Gain 21 lb 12.8 oz (9.888 kg) Urinalysis:      Fetal Status: Fetal Heart Rate (bpm): 120   Movement: Present  Presentation: Vertex  General:  Alert, oriented and cooperative. Patient is in no acute distress.  Skin: Skin is warm and dry. No rash noted.   Cardiovascular: Normal heart rate noted  Respiratory: Normal respiratory effort, no problems with respiration  noted  Abdomen: Soft, gravid, appropriate for gestational age. Pain/Pressure: Absent     Pelvic:  Cervical exam deferred        Extremities: Normal range of motion.     ental Status: Normal mood and affect. Normal behavior. Normal judgment and thought content.   Baseline: 120 Variability: moderate Accelerations: present Decelerations: absent Tocometry: N/A The patient was monitored for 30 minutes, fetal heart rate tracing was deemed reactive, category II tracing,  CPT [phone]   Assessment   19 y.o. G1P0 at [redacted]w[redacted]d by  10/23/2020, by Last Menstrual Period presenting for routine prenatal visit  Plan   pregnancy Problems (from 03/09/20 to present)    Problem Noted Resolved   Poor fetal growth affecting management of mother in third trimester 09/06/2020 by 09/08/2020, MD No   Overview Signed 09/06/2020  5:14 PM by 09/08/2020, MD    32 week growth scan with IUGR based on [facility] of 5.4%ile      Herpes simplex type 2 (HSV-2) infection affecting pregnancy, antepartum 03/09/2020 by 03/11/2020, MD No   Supervision of normal first pregnancy, antepartum 03/09/2020 by 03/11/2020, MD No   Overview Addendum 09/20/2020  3:03 PM by 11/20/2020, MD    [facility]inic [facility] Prenatal Labs  Dating By LMP c/w 7w u/s Blood type: A/Positive/-- (03/24 1434)   Genetic Screen MaterniT21 normal XY Antibody:Negative (03/24 1434)  Anatomic [birth year]1985 Complete 6/23 Rubella: 10.50 (03/24 1434)  Varicella: immune  GTT 28 wk: 151 3-hr 76, 170, 75, 98 RPR: Non Reactive (03/24 1434)   Rhogam  not needed HBsAg: Negative (03/24 1434)   Vaccines TDAP:not done                     Flu Shot:declined HIV: Non Reactive (03/24 1434)   Baby Food  GBS:   Contraception  Pap: under 21  HSV +2020  Valtrex 36 weeks   CS/VBAC no   Support Person     IUGR - [redacted]w[redacted]d growth scan EFW 4lbs 3oz (1894g) c/w 26.1% but AC 5.4%ile.      Previous Version       Gestational age appropriate  obstetric precautions including but not limited to vaginal bleeding, contractions, leaking of fluid and fetal movement were reviewed in detail with the patient.    - missed growth scan today, declines rescheduling - IOL scheduled for 10/20  Return if symptoms worsen or fail to improve.  Vena Austria, MD, Evern Core [facility], [facility] 09/30/2020, 10:25 AM

## 2020-10-01 LAB — CULTURE, BETA STREP (GROUP B ONLY): Strep Gp B Culture: NEGATIVE

## 2020-10-03 ENCOUNTER — Encounter: Payer: Medicaid Other | Admitting: Obstetrics and Gynecology

## 2020-10-03 ENCOUNTER — Telehealth: Payer: Self-pay | Admitting: Obstetrics and Gynecology

## 2020-10-03 NOTE — Telephone Encounter (Signed)
I rec'd msg from pt asking if she could have covid test done elsewhere because of an appt in [place] tomorrow. PAT adv that testing has to be done at [facility]. Per her DPR, I left detailed msg for her regarding her appt tomorrow morning for covid testing between 8[facility]10:30 am [facility]

## 2020-10-04 ENCOUNTER — Telehealth: Payer: Self-pay

## 2020-10-04 ENCOUNTER — Other Ambulatory Visit: Admission: RE | Admit: 2020-10-04 | Payer: Medicaid Other | Source: Ambulatory Visit

## 2020-10-05 ENCOUNTER — Inpatient Hospital Stay: Payer: Medicaid Other | Admitting: Anesthesiology

## 2020-10-05 ENCOUNTER — Other Ambulatory Visit: Payer: Self-pay

## 2020-10-05 ENCOUNTER — Inpatient Hospital Stay
Admission: EM | Admit: 2020-10-05 | Discharge: 2020-10-08 | DRG: 806 | Disposition: A | Discharge status: Home / Self Care | Payer: Medicaid Other | Attending: Obstetrics and Gynecology | Admitting: Obstetrics and Gynecology

## 2020-10-05 ENCOUNTER — Encounter: Payer: Self-pay | Admitting: Obstetrics & Gynecology

## 2020-10-05 DIAGNOSIS — B009 Herpesviral infection, unspecified: Secondary | ICD-10-CM

## 2020-10-05 DIAGNOSIS — O365930 Maternal care for other known or suspected poor fetal growth, third trimester, not applicable or unspecified: Principal | ICD-10-CM

## 2020-10-05 DIAGNOSIS — Z3400 Encounter for supervision of normal first pregnancy, unspecified trimester: Secondary | ICD-10-CM

## 2020-10-05 DIAGNOSIS — O9081 Anemia of the puerperium: Secondary | ICD-10-CM | POA: Diagnosis not present

## 2020-10-05 DIAGNOSIS — O99334 Smoking (tobacco) complicating childbirth: Secondary | ICD-10-CM | POA: Diagnosis present

## 2020-10-05 DIAGNOSIS — F17200 Nicotine dependence, unspecified, uncomplicated: Secondary | ICD-10-CM | POA: Diagnosis present

## 2020-10-05 DIAGNOSIS — Z3A37 37 weeks gestation of pregnancy: Secondary | ICD-10-CM | POA: Diagnosis not present

## 2020-10-05 DIAGNOSIS — Z20822 Contact with and (suspected) exposure to covid-19: Secondary | ICD-10-CM | POA: Diagnosis present

## 2020-10-05 DIAGNOSIS — Z23 Encounter for immunization: Secondary | ICD-10-CM | POA: Diagnosis not present

## 2020-10-05 DIAGNOSIS — O98519 Other viral diseases complicating pregnancy, unspecified trimester: Secondary | ICD-10-CM

## 2020-10-05 DIAGNOSIS — D62 Acute posthemorrhagic anemia: Secondary | ICD-10-CM | POA: Diagnosis not present

## 2020-10-05 DIAGNOSIS — O365990 Maternal care for other known or suspected poor fetal growth, unspecified trimester, not applicable or unspecified: Secondary | ICD-10-CM | POA: Diagnosis present

## 2020-10-05 LAB — URINE DRUG SCREEN, QUALITATIVE (ARMC ONLY)
Amphetamines, Ur Screen: NOT DETECTED
Barbiturates, Ur Screen: NOT DETECTED
Benzodiazepine, Ur Scrn: NOT DETECTED
Cannabinoid 50 Ng, Ur ~~LOC~~: NOT DETECTED
Cocaine Metabolite,Ur ~~LOC~~: NOT DETECTED
MDMA (Ecstasy)Ur Screen: NOT DETECTED
Methadone Scn, Ur: NOT DETECTED
Opiate, Ur Screen: NOT DETECTED
Phencyclidine (PCP) Ur S: NOT DETECTED
Tricyclic, Ur Screen: NOT DETECTED

## 2020-10-05 LAB — CBC
HCT: 33.8 % — ABNORMAL LOW (ref 36.0–46.0)
Hemoglobin: 11.6 g/dL — ABNORMAL LOW (ref 12.0–15.0)
MCH: 30.1 pg (ref 26.0–34.0)
MCHC: 34.3 g/dL (ref 30.0–36.0)
MCV: 87.6 fL (ref 80.0–100.0)
Platelets: 221 10*3/uL (ref 150–400)
RBC: 3.86 MIL/uL — ABNORMAL LOW (ref 3.87–5.11)
RDW: 13.1 % (ref 11.5–15.5)
WBC: 10.5 10*3/uL (ref 4.0–10.5)
nRBC: 0 % (ref 0.0–0.2)

## 2020-10-05 LAB — RESPIRATORY PANEL BY RT PCR (FLU A&B, COVID)
Influenza A by PCR: NEGATIVE
Influenza B by PCR: NEGATIVE
SARS Coronavirus 2 by RT PCR: NEGATIVE

## 2020-10-05 LAB — ABO/RH: ABO/RH(D): A POS

## 2020-10-05 LAB — TYPE AND SCREEN
ABO/RH(D): A POS
Antibody Screen: NEGATIVE

## 2020-10-05 MED ORDER — LACTATED RINGERS IV SOLN: INTRAVENOUS | Status: DC

## 2020-10-05 MED ORDER — LACTATED RINGERS IV SOLN
500.0000 mL | INTRAVENOUS | Status: DC | PRN
  Administered 2020-10-05 (×3): 500 mL via INTRAVENOUS

## 2020-10-05 MED ORDER — OXYTOCIN-SODIUM CHLORIDE 30-0.9 UT/500ML-% IV SOLN
1.0000 m[IU]/min | INTRAVENOUS | Status: DC
  Administered 2020-10-05: 2 m[IU]/min via INTRAVENOUS

## 2020-10-05 MED ORDER — TERBUTALINE SULFATE 1 MG/ML IJ SOLN: 0.2500 mg | Freq: Once | INTRAMUSCULAR | Status: DC | PRN

## 2020-10-05 MED ORDER — FENTANYL 2.5 MCG/ML W/ROPIVACAINE 0.15% IN NS 100 ML EPIDURAL (ARMC)
EPIDURAL | Status: AC
  Filled 2020-10-05: qty 100

## 2020-10-05 MED ORDER — LIDOCAINE-EPINEPHRINE (PF) 1.5 %-1:200000 IJ SOLN
INTRAMUSCULAR | Status: DC | PRN
  Administered 2020-10-05: 3 mL via EPIDURAL

## 2020-10-05 MED ORDER — AMMONIA AROMATIC IN INHA
RESPIRATORY_TRACT | Status: AC
  Filled 2020-10-05: qty 10

## 2020-10-05 MED ORDER — OXYTOCIN 10 UNIT/ML IJ SOLN
INTRAMUSCULAR | Status: AC
  Filled 2020-10-05: qty 2

## 2020-10-05 MED ORDER — ACETAMINOPHEN 325 MG PO TABS: 650.0000 mg | ORAL_TABLET | ORAL | Status: DC | PRN

## 2020-10-05 MED ORDER — BUPIVACAINE HCL (PF) 0.25 % IJ SOLN
INTRAMUSCULAR | Status: DC | PRN
  Administered 2020-10-05 (×2): 4 mL via EPIDURAL

## 2020-10-05 MED ORDER — BUTORPHANOL TARTRATE 1 MG/ML IJ SOLN
1.0000 mg | INTRAMUSCULAR | Status: DC | PRN
  Administered 2020-10-05 (×2): 1 mg via INTRAVENOUS
  Filled 2020-10-05 (×2): qty 1

## 2020-10-05 MED ORDER — OXYTOCIN BOLUS FROM INFUSION
333.0000 mL | Freq: Once | INTRAVENOUS | Status: AC
  Administered 2020-10-06: 333 mL via INTRAVENOUS

## 2020-10-05 MED ORDER — NITROFURANTOIN MONOHYD MACRO 100 MG PO CAPS
100.0000 mg | ORAL_CAPSULE | Freq: Two times a day (BID) | ORAL | Status: DC
  Filled 2020-10-05: qty 1

## 2020-10-05 MED ORDER — ONDANSETRON HCL 4 MG/2ML IJ SOLN
4.0000 mg | Freq: Four times a day (QID) | INTRAMUSCULAR | Status: DC | PRN
  Administered 2020-10-05: 4 mg via INTRAVENOUS
  Filled 2020-10-05: qty 2

## 2020-10-05 MED ORDER — MISOPROSTOL 200 MCG PO TABS
ORAL_TABLET | ORAL | Status: AC
  Administered 2020-10-05: 25 ug via VAGINAL
  Filled 2020-10-05: qty 4

## 2020-10-05 MED ORDER — LIDOCAINE HCL (PF) 1 % IJ SOLN
30.0000 mL | INTRAMUSCULAR | Status: DC | PRN
  Filled 2020-10-05: qty 30

## 2020-10-05 MED ORDER — LACTATED RINGERS IV BOLUS
500.0000 mL | Freq: Once | INTRAVENOUS | Status: AC
  Administered 2020-10-05: 500 mL via INTRAVENOUS

## 2020-10-05 MED ORDER — LIDOCAINE HCL (PF) 1 % IJ SOLN
INTRAMUSCULAR | Status: DC | PRN
  Administered 2020-10-05: 2 mL via SUBCUTANEOUS

## 2020-10-05 MED ORDER — FENTANYL 2.5 MCG/ML W/ROPIVACAINE 0.15% IN NS 100 ML EPIDURAL (ARMC)
EPIDURAL | Status: DC | PRN
Start: 2020-10-05 — End: 2020-10-06
  Administered 2020-10-05: 12 mL/h via EPIDURAL

## 2020-10-05 MED ORDER — OXYTOCIN-SODIUM CHLORIDE 30-0.9 UT/500ML-% IV SOLN
2.5000 [IU]/h | INTRAVENOUS | Status: DC
  Filled 2020-10-05 (×2): qty 500

## 2020-10-05 MED ORDER — MISOPROSTOL 100 MCG PO TABS
25.0000 ug | ORAL_TABLET | ORAL | Status: DC | PRN
  Administered 2020-10-05: 25 ug via VAGINAL
  Filled 2020-10-05 (×3): qty 1

## 2020-10-05 NOTE — Progress Notes (Signed)
Chelsea Andrade is a 19 y.o. G1P0 at [redacted]w[redacted]d by ultrasound admitted for induction of labor due to Poor fetal growth.  Subjective:  Chelsea Andrade has slept quite a bit since being admitted.  She does not feel her contractions as painful.  Objective: BP (!) 95/59 (BP Location: Right Arm)    Pulse 78    Temp 98.4 F (36.9 C) (Oral)    Resp 16    Ht 5\' 5"  (1.651 m)    Wt 63.5 kg    LMP 01/17/2020 (Exact Date)    BMI 23.30 kg/m  No intake/output data recorded. No intake/output data recorded.  FHT:  FHR: 130 baseline bpm, variability: moderate,  accelerations:  Present,  decelerations:  Absent UC:   regular, every 3 minutes SVE:   Dilation: 1.5 Effacement (%): Thick Station: -3 Exam by:: [email or web address], CNM  Bishop score:  3  Labs: Lab Results  Component Value Date   WBC 10.5 10/05/2020   HGB 11.6 (L) 10/05/2020   HCT 33.8 (L) 10/05/2020   MCV 87.6 10/05/2020   PLT 221 10/05/2020    Assessment / Plan: Induction of labor due to IUGR,  progressing well on pitocin  Labor: cervical ripening in progress  Fetal Wellbeing:  Category I Pain Control:  Epidural  Planned  I/D:  n/a Anticipated MOD:  NSVD  Review of patient's hx , a UDS is ordered, and requested by the  NBN.  10/07/2020 10/05/2020, 10:21 AM

## 2020-10-05 NOTE — Progress Notes (Signed)
Paged MD to notify him of patients, awaiting call back.

## 2020-10-05 NOTE — Progress Notes (Signed)
Chelsea Andrade is a 19 y.o. G1P0 at [redacted]w[redacted]d by ultrasound admitted for induction of labor due to Poor fetal growth.  Subjective: Very comfortable. Has had numerous position schanges to improve the FHT tracing . SROM this last hour- clear fluid seen. She denies any rectal pressure.  Dr. Bonney Aid present to see patient and view the FHT strip.   Objective: BP 100/64   Pulse 76   Temp 97.8 F (36.6 C) (Oral)   Resp 16   Ht 5\' 5"  (1.651 m)   Wt 63.5 kg   LMP 01/17/2020 (Exact Date)   SpO2 100%   BMI 23.30 kg/m  No intake/output data recorded. Total I/O In: -  Out: 750 [Urine:750]  FHT:  FHR: 125 bpm, variability: moderate,  accelerations:  Present,  decelerations:  Present had a period of repetetive variabl decels with contractions shortly after SROM. Now early decels noted  with moderate variability and accel ntoed with scalp stimulation. UC:   regular, every 2-3 minutes SVE:   Dilation: 4 Effacement (%): 80 Station: -2 Exam by:: [email or web address], CNM  Labs: Lab Results  Component Value Date   WBC 10.5 10/05/2020   HGB 11.6 (L) 10/05/2020   HCT 33.8 (L) 10/05/2020   MCV 87.6 10/05/2020   PLT 221 10/05/2020    Assessment / Plan: Induction of labor due to IUGR,  progressing well on pitocin  Labor: Progressing normally . Recenl SROM- clear fluid.  Fetal Wellbeing:  Category II. Over all reassuring. Will monitor closer Pain Control:  Epidural I/D:  n/a Anticipated MOD:  NSVD  Dr. 10/07/2020 present at bedside for SVE. Discussed the FHT pattern tracing with the patient and her family. Rest encouraged. Will recheck PRN or in several hours.  Bonney Aid 10/05/2020, 9:37 PM

## 2020-10-05 NOTE — Progress Notes (Signed)
Eunice Blase at nurses desk strip evaluated by provider, gave verbal order to restart pitocin as needed after 30 mins of pitocin rest. Wil re-evaluate need for pitocin

## 2020-10-05 NOTE — Progress Notes (Signed)
Chelsea Andrade is a 19 y.o. G1P0 at [redacted]w[redacted]d by ultrasound admitted for induction of labor due to Poor fetal growth.  Subjective:   Objective: BP 107/69   Pulse 80   Temp 97.8 F (36.6 C) (Oral)   Resp 16   Ht 5\' 5"  (1.651 m)   Wt 63.5 kg   LMP 01/17/2020 (Exact Date)   SpO2 100%   BMI 23.30 kg/m  No intake/output data recorded. Total I/O In: -  Out: 750 [Urine:750]  FHT:  FHR: 125 bpm, variability: moderate,  accelerations:  Present,  decelerations:  Absent UC:   regular, every 1.5  Minutes, mild to moderate intensity SVE:   Dilation: 4 Effacement (%): 80 Station: -3 Exam by:: [email or web address], CNM  Pitocin at 28mu/min  Labs: Lab Results  Component Value Date   WBC 10.5 10/05/2020   HGB 11.6 (L) 10/05/2020   HCT 33.8 (L) 10/05/2020   MCV 87.6 10/05/2020   PLT 221 10/05/2020    Assessment / Plan: Induction of labor due to IUGR,  progressing well on pitocin  Labor: Progressing normally  Will decrease the pitocin to 7mu/min   Fetal Wellbeing:  Category I Pain Control:  Epidural I/D:  n/a Anticipated MOD:  NSVD  Will recheck in several hours. Plan for AROM.  3m 10/05/2020, 7:46 PM

## 2020-10-05 NOTE — Anesthesia Procedure Notes (Signed)
Epidural Patient location during procedure: OB Start time: 10/05/2020 3:50 PM End time: 10/05/2020 3:55 PM  Staffing Anesthesiologist: Karleen Hampshire, MD Resident/CRNA: Irving Burton, CRNA Performed: resident/CRNA   Preanesthetic Checklist Completed: patient identified, IV checked, site marked, risks and benefits discussed, surgical consent, monitors and equipment checked, pre-op evaluation and timeout performed  Epidural Patient position: sitting Prep: ChloraPrep Patient monitoring: heart rate, continuous pulse ox and blood pressure Approach: midline Location: L3-L4 Injection technique: LOR air  Needle:  Needle type: Tuohy  Needle gauge: 17 G Needle length: 9 cm and 9 Needle insertion depth: 6 cm Catheter type: closed end flexible Catheter size: 19 Gauge Catheter at skin depth: 11 cm Test dose: negative and 1.5% lidocaine with Epi 1:200 K  Assessment Sensory level: T10 Events: blood not aspirated, injection not painful, no injection resistance, no paresthesia and negative IV test  Additional Notes 1 attempt Pt. Evaluated and documentation done after procedure finished. Patient identified. Risks/Benefits/Options discussed with patient including but not limited to bleeding, infection, nerve damage, paralysis, failed block, incomplete pain control, headache, blood pressure changes, nausea, vomiting, reactions to medication both or allergic, itching and postpartum back pain. Confirmed with bedside nurse the patient's most recent platelet count. Confirmed with patient that they are not currently taking any anticoagulation, have any bleeding history or any family history of bleeding disorders. Patient expressed understanding and wished to proceed. All questions were answered. Sterile technique was used throughout the entire procedure. Please see nursing notes for vital signs. Test dose was given through epidural catheter and negative prior to continuing to dose epidural or  start infusion. Warning signs of high block given to the patient including shortness of breath, tingling/numbness in hands, complete motor block, or any concerning symptoms with instructions to call for help. Patient was given instructions on fall risk and not to get out of bed. All questions and concerns addressed with instructions to call with any issues or inadequate analgesia.   Patient tolerated the insertion well without immediate complications.Reason for block:procedure for pain

## 2020-10-05 NOTE — Progress Notes (Signed)
Chelsea Andrade is a 19 y.o. G1P0 at [redacted]w[redacted]d by ultrasound admitted for induction of labor due to Poor fetal growth.  Subjective: she is now very comfortable. Had an epidural placed.   Objective: BP (!) 99/58   Pulse 78   Temp 98.1 F (36.7 C) (Oral)   Resp 16   Ht 5\' 5"  (1.651 m)   Wt 63.5 kg   LMP 01/17/2020 (Exact Date)   SpO2 94%   BMI 23.30 kg/m  No intake/output data recorded. No intake/output data recorded.  FHT:  FHR: 110 bpm, variability: minimal-moderate variability at present ,  accelerations:  Present,  decelerations:  Present some drop in baseline noted this last 15 minutes to 110. UC:   regular, every 2-3 minutes SVE:   Dilation: 3.5 Effacement (%): 70 Station: -3 Exam by:: [email or web address], CNM    Labs: Lab Results  Component Value Date   WBC 10.5 10/05/2020   HGB 11.6 (L) 10/05/2020   HCT 33.8 (L) 10/05/2020   MCV 87.6 10/05/2020   PLT 221 10/05/2020    Assessment / Plan: Spontaneous labor, progressing normally  Labor: adequate progress with cytotec  Fetal Wellbeing:  Category II Pain Control:  Epidural I/D:  n/a Anticipated MOD:  NSVD  Will observe for contraction pattern. Anticipate starting pitocin. Dr. 10/07/2020 updated on maternal/fetal status.  Bonney Aid 10/05/2020, 5:26 PM

## 2020-10-05 NOTE — Progress Notes (Signed)
Chelsea Andrade is a 19 y.o. G1P0 at [redacted]w[redacted]d by ultrasound admitted for induction of labor due to Poor fetal growth.  Subjective: she is becoming very uncomfortable, and is asking for pain medication. Feeling her pain both in her back and suprapubicly.  Objective: BP 112/67 (BP Location: Right Arm)   Pulse 76   Temp 98.2 F (36.8 C) (Oral)   Resp 16   Ht 5\' 5"  (1.651 m)   Wt 63.5 kg   LMP 01/17/2020 (Exact Date)   BMI 23.30 kg/m  No intake/output data recorded. No intake/output data recorded.  FHT:  FHR: 130 bpm, variability: moderate,  accelerations:  Present,  decelerations:  Absent UC:   regular, every 2-3  Minutes, palpating mild to moderate. SVE:   Dilation: 3 Effacement (%): 50 Station: -3 Exam by:: [email or web address], CNM  Chelsea Andrade is 3  Labs: Lab Results  Component Value Date   WBC 10.5 10/05/2020   HGB 11.6 (L) 10/05/2020   HCT 33.8 (L) 10/05/2020   MCV 87.6 10/05/2020   PLT 221 10/05/2020    Assessment / Plan: Induction of labor due to IUGR,  With ongoing cervical ripening. Noticeable cervical change.  Labor: contracting regularly after second dose of Cytotec. Fetal Wellbeing:  Category I Pain Control:  IV pain meds requested I/D:  n/a Anticipated MOD:  NSVD  Will provide IV Stadol now. Recheck in several hours or PRN. Will commence pitocin once Chelsea Andrade score is 6.  10/07/2020 10/05/2020, 2:11 PM

## 2020-10-05 NOTE — Progress Notes (Addendum)
Patient arrived to unit wheeled by ED staff for scheduled elective induction for IUGR. Patient is [redacted]w[redacted]d G1P0. Patient denies symtoms consistent with ruptured membranes or active vaginal bleeding. Patient placed on continuous EFM and TOCO to non tender area of abdomen. Patient oriented to care environment and education on plan for induction. Sterile vaginal exam performed and cervix is 0.5 (dimple- outer os)/thick/high and intact. Patient history reviewed. Will notify provider of patient's arrival.

## 2020-10-05 NOTE — Progress Notes (Signed)
SBAR given to Dr. Tiburcio Pea upon returned call. MD Acknowledged receipt. Patient states she did not finish her remaining doses of macrobid for UTI and would like to be continued. Doctor states to continue with patient's induction of labor as orders have been signed and held. No new orders received.

## 2020-10-05 NOTE — Anesthesia Preprocedure Evaluation (Signed)
Anesthesia Evaluation  Patient identified by MRN, date of birth, ID band Patient awake    Reviewed: Allergy & Precautions, H&P , NPO status , Patient's Chart, lab work & pertinent test results  Airway Mallampati: II   Neck ROM: full    Dental no notable dental hx.    Pulmonary asthma , Current Smoker,    Pulmonary exam normal        Cardiovascular Normal cardiovascular exam     Neuro/Psych PSYCHIATRIC DISORDERS Anxiety Depression Bipolar Disorder    GI/Hepatic   Endo/Other    Renal/GU      Musculoskeletal   Abdominal   Peds  Hematology  (+) Blood dyscrasia, anemia ,   Anesthesia Other Findings   Reproductive/Obstetrics                             Anesthesia Physical Anesthesia Plan  ASA: II  Anesthesia Plan: Epidural   Post-op Pain Management:    Induction:   PONV Risk Score and Plan:   Airway Management Planned:   Additional Equipment:   Intra-op Plan:   Post-operative Plan:   Informed Consent: I have reviewed the patients History and Physical, chart, labs and discussed the procedure including the risks, benefits and alternatives for the proposed anesthesia with the patient or authorized representative who has indicated his/her understanding and acceptance.       Plan Discussed with: Anesthesiologist  Anesthesia Plan Comments:         Anesthesia Quick Evaluation

## 2020-10-06 DIAGNOSIS — Z3A37 37 weeks gestation of pregnancy: Secondary | ICD-10-CM | POA: Diagnosis not present

## 2020-10-06 DIAGNOSIS — O365930 Maternal care for other known or suspected poor fetal growth, third trimester, not applicable or unspecified: Principal | ICD-10-CM

## 2020-10-06 LAB — RPR: RPR Ser Ql: NONREACTIVE

## 2020-10-06 MED ORDER — IBUPROFEN 600 MG PO TABS
600.0000 mg | ORAL_TABLET | Freq: Four times a day (QID) | ORAL | Status: DC
  Administered 2020-10-06 – 2020-10-08 (×8): 600 mg via ORAL
  Filled 2020-10-06 (×8): qty 1

## 2020-10-06 MED ORDER — BENZOCAINE-MENTHOL 20-0.5 % EX AERO: 1.0000 "application " | INHALATION_SPRAY | CUTANEOUS | Status: DC | PRN

## 2020-10-06 MED ORDER — ONDANSETRON HCL 4 MG/2ML IJ SOLN: 4.0000 mg | INTRAMUSCULAR | Status: DC | PRN

## 2020-10-06 MED ORDER — COCONUT OIL OIL
1.0000 "application " | TOPICAL_OIL | Status: DC | PRN
  Filled 2020-10-06: qty 120

## 2020-10-06 MED ORDER — IBUPROFEN 600 MG PO TABS
600.0000 mg | ORAL_TABLET | Freq: Four times a day (QID) | ORAL | Status: DC
  Administered 2020-10-06: 600 mg via ORAL
  Filled 2020-10-06: qty 1

## 2020-10-06 MED ORDER — ZOLPIDEM TARTRATE 5 MG PO TABS: 5.0000 mg | ORAL_TABLET | Freq: Every evening | ORAL | Status: DC | PRN

## 2020-10-06 MED ORDER — NITROFURANTOIN MONOHYD MACRO 100 MG PO CAPS
100.0000 mg | ORAL_CAPSULE | Freq: Two times a day (BID) | ORAL | Status: AC
  Administered 2020-10-06 – 2020-10-08 (×5): 100 mg via ORAL
  Filled 2020-10-06 (×8): qty 1

## 2020-10-06 MED ORDER — DOCUSATE SODIUM 100 MG PO CAPS
100.0000 mg | ORAL_CAPSULE | Freq: Two times a day (BID) | ORAL | Status: DC
  Administered 2020-10-06 – 2020-10-08 (×4): 100 mg via ORAL
  Filled 2020-10-06 (×4): qty 1

## 2020-10-06 MED ORDER — DIBUCAINE (PERIANAL) 1 % EX OINT: 1.0000 "application " | TOPICAL_OINTMENT | CUTANEOUS | Status: DC | PRN

## 2020-10-06 MED ORDER — SIMETHICONE 80 MG PO CHEW: 80.0000 mg | CHEWABLE_TABLET | ORAL | Status: DC | PRN

## 2020-10-06 MED ORDER — FERROUS SULFATE 325 (65 FE) MG PO TABS
325.0000 mg | ORAL_TABLET | Freq: Every day | ORAL | Status: DC
  Administered 2020-10-06 – 2020-10-08 (×3): 325 mg via ORAL
  Filled 2020-10-06 (×3): qty 1

## 2020-10-06 MED ORDER — WITCH HAZEL-GLYCERIN EX PADS: 1.0000 "application " | MEDICATED_PAD | CUTANEOUS | Status: DC | PRN

## 2020-10-06 MED ORDER — ACETAMINOPHEN 325 MG PO TABS
650.0000 mg | ORAL_TABLET | ORAL | Status: DC | PRN
  Administered 2020-10-07: 650 mg via ORAL
  Filled 2020-10-06: qty 2

## 2020-10-06 MED ORDER — ONDANSETRON HCL 4 MG PO TABS: 4.0000 mg | ORAL_TABLET | ORAL | Status: DC | PRN

## 2020-10-06 MED ORDER — TETANUS-DIPHTH-ACELL PERTUSSIS 5-2.5-18.5 LF-MCG/0.5 IM SUSP
0.5000 mL | Freq: Once | INTRAMUSCULAR | Status: AC
  Administered 2020-10-08: 0.5 mL via INTRAMUSCULAR
  Filled 2020-10-06: qty 0.5

## 2020-10-06 MED ORDER — PRENATAL MULTIVITAMIN CH
1.0000 | ORAL_TABLET | Freq: Every day | ORAL | Status: DC
  Administered 2020-10-06 – 2020-10-07 (×2): 1 via ORAL
  Filled 2020-10-06 (×2): qty 1

## 2020-10-06 MED ORDER — DIPHENHYDRAMINE HCL 25 MG PO CAPS: 25.0000 mg | ORAL_CAPSULE | Freq: Four times a day (QID) | ORAL | Status: DC | PRN

## 2020-10-06 NOTE — Progress Notes (Signed)
Spoke with Joni Reining from Infection Prevention regarding patient's isolation status. Patient reported to RN, [employer], that she is RSV positive and has had a cold for approx. 3 weeks. Unable to find results of a positive RSV test in patient's chart. According to infection prevention, patient does not need to be on isolation at this time. Joni Reining to follow up in the morning.

## 2020-10-06 NOTE — Discharge Summary (Addendum)
Postpartum Discharge Summary  Date of Service updated 10/08/2020     Patient Name: Chelsea Andrade DOB: [birth year] MRN: [id number]  Date of admission: 10/05/2020 Delivery date:10/05/2020  Delivering provider: Imagene Riches  Date of discharge: 10/08/2020  Admitting diagnosis: IUGR (intrauterine growth restriction) affecting care of mother [O36.5990] Intrauterine pregnancy: [redacted]w[redacted]d     Secondary diagnosis:  Active Problems:   IUGR (intrauterine growth restriction) affecting care of mother   Normal labor and delivery   Postpartum care following vaginal delivery   Single liveborn infant delivered vaginally   Encounter for care or examination of lactating mother  Additional problems: none    Discharge diagnosis: Term Pregnancy Delivered                                              Post partum procedures:none Augmentation: Pitocin Complications: None  Hospital course: Induction of Labor With Vaginal Delivery   19 y.o. yo G1P0 at [redacted]w[redacted]d was admitted to the hospital 10/05/2020 for induction of labor.  Indication for induction: IUGR.  Patient had an uncomplicated labor course as follows: Membrane Rupture Time/Date: 8:57 PM ,10/05/2020   Delivery Method:Vaginal, Spontaneous  Episiotomy: None  Lacerations:  1st degree;Labial  Details of delivery can be found in separate delivery note.  Patient had a routine postpartum course. She mentions a flap of vaginal tissue that hangs out of vagina when she is voiding. Discussed not doing anything with the tissue flap at this time, re-evaluating at 2 week postpartum visit for further repair/ligation. Patient is discharged home 10/08/20.  Newborn Data: Birth date:10/06/2020  Birth time:12:02 AM  Gender:Female  Living status:Living  Apgars: 8, 9 Weight:2530 g   Magnesium Sulfate received: No BMZ received: No Rhophylac:N/A MMR:No T-DaP:Given postpartum Flu: No Transfusion:No  Physical exam  Vitals:   10/07/20 0735 10/07/20 1528  10/08/20 0006 10/08/20 0752  BP: 105/64 1$RemoveBef'01/62 99/67 97/63 'JDNcxbLjwz$  Pulse: 82 73 66 75  Resp: $Remo'20 18 18 18  'WuKsA$ Temp: 98.5 F (36.9 C) 98.5 F (36.9 C) 98.3 F (36.8 C) 97.8 F (36.6 C)  TempSrc: Oral Oral Oral Oral  SpO2: 100%  100% 100%  Weight:      Height:       General: alert, cooperative and no distress Lochia: appropriate Uterine Fundus: firm Incision: N/A Genital: small flap of vaginal tissue at lower introitus that has suture material in it and not attached to vaginal wall- approximately 1 cm x 0.5 cm DVT Evaluation: No evidence of DVT seen on physical exam.  Labs: Lab Results  Component Value Date   WBC 11.4 (H) 10/07/2020   HGB 11.1 (L) 10/07/2020   HCT 31.5 (L) 10/07/2020   MCV 86.8 10/07/2020   PLT 191 10/07/2020   CMP Latest Ref Rng & Units 12/31/2019  Glucose 70 - 99 mg/dL 94  BUN 6 - 20 mg/dL 12  Creatinine 0.44 - 1.00 mg/dL 0.61  Sodium 135 - 145 mmol/L 141  Potassium 3.5 - 5.1 mmol/L 3.7  Chloride 98 - 111 mmol/L 110  CO2 22 - 32 mmol/L 21(L)  Calcium 8.9 - 10.3 mg/dL 9.5  Total Protein 6.5 - 8.1 g/dL -  Total Bilirubin 0.3 - 1.2 mg/dL -  Alkaline Phos 38 - 126 U/L -  AST 15 - 41 U/L -  ALT 0 - 44 U/L -   Edinburgh Score: Edinburgh Postnatal Depression Scale  Screening Tool 10/06/2020  I have been able to laugh and see the funny side of things. 0  I have looked forward with enjoyment to things. 0  I have blamed myself unnecessarily when things went wrong. 2  I have been anxious or worried for no good reason. 2  I have felt scared or panicky for no good reason. 1  Things have been getting on top of me. 0  I have been so unhappy that I have had difficulty sleeping. 0  I have felt sad or miserable. 1  I have been so unhappy that I have been crying. 1  The thought of harming myself has occurred to me. 0  Edinburgh Postnatal Depression Scale Total 7      After visit meds:  Allergies as of 10/08/2020   No Known Allergies     Medication List    STOP  taking these medications   nitrofurantoin (macrocrystal-monohydrate) 100 MG capsule Commonly known as: Macrobid   valACYclovir 500 MG tablet Commonly known as: VALTREX     TAKE these medications   ferrous sulfate 325 (65 FE) MG tablet Commonly known as: FerrouSul Take 1 tablet (325 mg total) by mouth daily with breakfast.   prenatal multivitamin Tabs tablet Take 1 tablet by mouth daily at 12 noon.        Discharge home in stable condition Infant Feeding: Breast and supplementing with formula (encouraged limiting formula while establishing milk supply) Infant Disposition:home with mother Discharge instruction: per After Visit Summary and Postpartum booklet. Activity: Advance as tolerated. Pelvic rest for 6 weeks.  Diet: routine diet Anticipated Birth Control: POPs Postpartum Appointment:2 weeks Additional Postpartum F/U: 6 weeks PP Future Appointments:No future appointments.   Follow up Visit:  Follow-up Information    Imagene Riches, CNM Follow up in 2 week(s).   Specialties: Obstetrics, Gynecology Why: Make an appointment for a postpartum visit in 2 weeks  and in 6 weeks. Contact information: [address] [place] [address] [phone]                10/08/2020 Rod Can, CNM

## 2020-10-06 NOTE — Lactation Note (Signed)
This note was copied from a baby's chart. Lactation Consultation Note  Patient Name: Chelsea Andrade Today's Date: 10/06/2020 Reason for consult: Initial assessment;1st time breastfeeding;Early term 37-38.6wks  Initial lactation visit. This is mom's first baby, SVD 13 hrs ago. Baby had 2 feedings post delivery, long period of sleep and had another feeding around 11am, void/stools above expectations for HOL. Mom noting no pain/discomfort only "tugging/pulls" while feeding. She did mention being sore at the end of feedings; coconut oil was given and education on use provided. LC praised mom for breastfeeding her baby. Discussed newborn feeding behaviors and stomach size, feeding patterns of 37week infants, early feeding cues, feeding on demand, benefits of skin to skin, and output expectations. Provided guidance on expectations of overnight, cluster feeding and growth spurts; use of coconut oil as needed. Encouraged good alignment and position of baby at the breast, using baby's current position in mom's arms, sandwiching tissue for deep latch, and signs of milk transfer throughout the feeding. Encouraged to seek support as needed. Lactation name/number is available on whiteboard.  Maternal Data Formula Feeding for Exclusion: No Has patient been taught Hand Expression?: Yes (mom was able to verbalize how to do it) Does the patient have breastfeeding experience prior to this delivery?: No  Feeding Feeding Type: Breast Fed  LATCH Score                   Interventions Interventions: Breast feeding basics reviewed  Lactation Tools Discussed/Used     Consult Status Consult Status: Follow-up Date: 10/06/20 Follow-up type: Call as needed    Danford Bad 10/06/2020, 2:58 PM

## 2020-10-06 NOTE — Anesthesia Postprocedure Evaluation (Signed)
Anesthesia Post Note  Patient: Geologist, engineering  Procedure(s) Performed: AN AD HOC LABOR EPIDURAL  Patient location during evaluation: Mother Baby Anesthesia Type: Epidural Level of consciousness: awake and alert Pain management: pain level controlled Vital Signs Assessment: post-procedure vital signs reviewed and stable Respiratory status: spontaneous breathing, nonlabored ventilation and respiratory function stable Cardiovascular status: stable Postop Assessment: no headache, no backache and epidural receding Anesthetic complications: no   No complications documented.   Last Vitals:  Vitals:   10/06/20 0426 10/06/20 0818  BP: 115/66 118/74  Pulse: 82 77  Resp: 18 20  Temp: 37 C 37 C  SpO2: 98% 99%    Last Pain:  Vitals:   10/06/20 0818  TempSrc: Oral  PainSc:                  Rosanne Gutting

## 2020-10-06 NOTE — Lactation Note (Signed)
This note was copied from a baby's chart. Lactation Consultation Note  Patient Name: Chelsea Andrade Today's Date: 10/06/2020   Lactation attempted initial visit with mom. Mom sleeping, as well as baby. Support person, GOB, told to call out when mom wakes.    Danford Bad 10/06/2020, 10:40 AM

## 2020-10-07 LAB — CBC
HCT: 31.5 % — ABNORMAL LOW (ref 36.0–46.0)
Hemoglobin: 11.1 g/dL — ABNORMAL LOW (ref 12.0–15.0)
MCH: 30.6 pg (ref 26.0–34.0)
MCHC: 35.2 g/dL (ref 30.0–36.0)
MCV: 86.8 fL (ref 80.0–100.0)
Platelets: 191 10*3/uL (ref 150–400)
RBC: 3.63 MIL/uL — ABNORMAL LOW (ref 3.87–5.11)
RDW: 13.1 % (ref 11.5–15.5)
WBC: 11.4 10*3/uL — ABNORMAL HIGH (ref 4.0–10.5)
nRBC: 0 % (ref 0.0–0.2)

## 2020-10-07 NOTE — Lactation Note (Addendum)
This note was copied from a baby's chart. Lactation Consultation Note  Patient Name: Chelsea Andrade Today's Date: 10/07/2020 Reason for consult: Follow-up assessment;Primapara;Early term 37-38.6wks   Maternal Data Has patient been taught Hand Expression?: Yes Does the patient have breastfeeding experience prior to this delivery?: No  Feeding BAby latched well to left breast in cradle hold while mom was massaging breast, needs stimulation to suck consistently, some swallows heard, nursed 20 min, burped well and place on right breast, some swallows heard but baby tired easily and nursed 9 min.  Mom set up with DEBP with instruction in use and cleaning, pumped in initiation mode, obtained few drops on each breast, this was wiped on gloved finger and placed inside baby's cheek, then mom's mom gave baby 15 cc Similac with Fe with slow flow nipple   LATCH Score Latch: Grasps breast easily, tongue down, lips flanged, rhythmical sucking.  Audible Swallowing: A few with stimulation  Type of Nipple: Everted at rest and after stimulation  Comfort (Breast/Nipple): Filling, red/small blisters or bruises, mild/mod discomfort  Hold (Positioning): Assistance needed to correctly position infant at breast and maintain latch.  LATCH Score: 7  Interventions Interventions: Breast feeding basics reviewed;Assisted with latch;Skin to skin;Breast massage;Hand express;Support pillows;Coconut oil;Comfort gels;DEBP  Lactation Tools Discussed/Used WIC Program: Yes Pump Review: Setup, frequency, and cleaning;Milk Storage Initiated by:: Cay Schillings Rnc IBCLC Date initiated:: 10/07/20 Pump as tolerated tonight after breast feeds to obtain supplement or supp with 10-15 cc formula after breastfeeding   Consult Status Consult Status: Follow-up Date: 10/08/20 Follow-up type: In-patient    Dyann Kief 10/07/2020, 5:42 PM

## 2020-10-07 NOTE — Progress Notes (Signed)
Subjective:  Patient is very tired during visit this morning. She is able to answer questions and sit up to nurse baby with latching assistance in football hold on right breast. She is tolerating regular diet. Her pain is controlled with PO medications. She is ambulating and voiding without difficulty. It is uncertain if baby will be discharged later today. Patient's preference is to discharge to home if all is well and is ok staying if there are any concerns.   Objective:  Vital signs in last 24 hours: Temp:  [98.2 F (36.8 C)-98.6 F (37 C)] 98.5 F (36.9 C) (10/22 0735) Pulse Rate:  [70-82] 82 (10/22 0735) Resp:  [20] 20 (10/22 0735) BP: (95-107)/(58-71) 105/64 (10/22 0735) SpO2:  [99 %-100 %] 100 % (10/22 0735)    General: NAD Pulmonary: no increased work of breathing Abdomen: non-distended, non-tender, fundus firm at level of umbilicus Extremities: no edema, no erythema, no tenderness  Results for orders placed or performed during the hospital encounter of 10/05/20 (from the past 72 hour(s))  CBC     Status: Abnormal   Collection Time: 10/05/20  5:28 AM  Result Value Ref Range   WBC 10.5 4.0 - 10.5 K/uL   RBC 3.86 (L) 3.87 - 5.11 MIL/uL   Hemoglobin 11.6 (L) 12.0 - 15.0 g/dL   HCT 56.3 (L) 36 - 46 %   MCV 87.6 80.0 - 100.0 fL   MCH 30.1 26.0 - 34.0 pg   MCHC 34.3 30.0 - 36.0 g/dL   RDW 14.9 70.2 - 63.7 %   Platelets 221 150 - 400 K/uL   nRBC 0.0 0.0 - 0.2 %    Comment: Performed at [facility], [address] [place], [place] [address]  RPR     Status: None   Collection Time: 10/05/20  5:28 AM  Result Value Ref Range   RPR Ser Ql NON REACTIVE NON REACTIVE    Comment: Performed at [facility], [address], [place], [place] [address]  Type and screen     Status: None   Collection Time: 10/05/20  5:28 AM  Result Value Ref Range   ABO/RH(D) A POS    Antibody Screen NEG    Sample Expiration      10/08/2020,2359 Performed at [facility], [address] [place], [place] [address]   Respiratory Panel by RT PCR (Flu A&B, Covid) - Nasopharyngeal Swab     Status: None   Collection Time: 10/05/20  5:28 AM   Specimen: Nasopharyngeal Swab  Result Value Ref Range   SARS Coronavirus 2 by RT PCR NEGATIVE NEGATIVE    Comment: (NOTE) SARS-CoV-2 target nucleic acids are NOT DETECTED.  The SARS-CoV-2 RNA is generally detectable in upper respiratoy specimens during the acute phase of infection. The lowest concentration of SARS-CoV-2 viral copies this assay can detect is 131 copies/mL. A negative result does not preclude SARS-Cov-2 infection and should not be used as the sole basis for treatment or other patient management decisions. A negative result may occur with  improper specimen collection/handling, submission of specimen other than nasopharyngeal swab, presence of viral mutation(s) within the areas targeted by this assay, and inadequate number of viral copies (<131 copies/mL). A negative result must be combined with clinical observations, patient history, and epidemiological information. The expected result is Negative.  Fact Sheet for Patients:  https://www.moore.com/  Fact Sheet for Healthcare Providers:  https://www.young.biz/  This test is no t yet approved or cleared by the [place] FDA and  has been authorized for detection and/or diagnosis of SARS-CoV-2 by FDA under an Emergency Use Authorization (EUA). This EUA will remain  in effect (meaning this test can be used) for the duration of the COVID-19 declaration under Section 564(b)(1) of the Act, 21 U.S.C. section 360bbb-3(b)(1), unless the authorization is terminated or revoked sooner.     Influenza A by PCR NEGATIVE NEGATIVE   Influenza B by PCR NEGATIVE NEGATIVE    Comment: (NOTE) The Xpert Xpress SARS-CoV-2/FLU/RSV assay is intended as an aid in  the diagnosis of influenza from Nasopharyngeal swab specimens  and  should not be used as a sole basis for treatment. Nasal washings and  aspirates are unacceptable for Xpert Xpress SARS-CoV-2/FLU/RSV  testing.  Fact Sheet for Patients: https://www.moore.com/  Fact Sheet for Healthcare Providers: https://www.young.biz/  This test is not yet approved or cleared by the [place] FDA and  has been authorized for detection and/or diagnosis of SARS-CoV-2 by  FDA under an Emergency Use Authorization (EUA). This EUA will remain  in effect (meaning this test can be used) for the duration of the  Covid-19 declaration under Section 564(b)(1) of the Act, 21  U.S.C. section 360bbb-3(b)(1), unless the authorization is  terminated or revoked. Performed at [facility], [address] [place], [place] [address]   ABO/Rh     Status: None   Collection Time: 10/05/20  7:49 AM  Result Value Ref Range   ABO/RH(D)      A POS Performed at [facility], [address] [place], [place] [address]   Urine Drug Screen, Qualitative [facility] only)     Status: None   Collection Time: 10/05/20 11:36 AM  Result Value Ref Range   Tricyclic, Ur Screen NONE DETECTED NONE DETECTED   Amphetamines, Ur Screen NONE DETECTED NONE DETECTED   MDMA (Ecstasy)Ur Screen NONE DETECTED NONE DETECTED   Cocaine Metabolite,Ur [place] NONE DETECTED NONE DETECTED   Opiate, Ur Screen NONE DETECTED NONE DETECTED   Phencyclidine (PCP) Ur S NONE DETECTED NONE DETECTED   Cannabinoid 50 Ng, Ur [place] NONE DETECTED NONE DETECTED   Barbiturates, Ur Screen NONE DETECTED NONE DETECTED   Benzodiazepine, Ur Scrn NONE DETECTED NONE DETECTED   Methadone Scn, Ur NONE DETECTED NONE DETECTED    Comment: (NOTE) Tricyclics + metabolites, urine    Cutoff 1000 ng/mL Amphetamines + metabolites, urine  Cutoff 1000 ng/mL MDMA (Ecstasy), urine              Cutoff 500 ng/mL Cocaine Metabolite, urine          Cutoff 300 ng/mL Opiate + metabolites, urine         Cutoff 300 ng/mL Phencyclidine (PCP), urine         Cutoff 25 ng/mL Cannabinoid, urine                 Cutoff 50 ng/mL Barbiturates + metabolites, urine  Cutoff 200 ng/mL Benzodiazepine, urine              Cutoff 200 ng/mL Methadone, urine                   Cutoff 300 ng/mL  The urine drug screen provides only a preliminary, unconfirmed analytical test result and should not be used for non-medical purposes. Clinical consideration and professional judgment should be applied to any positive drug screen result due to possible interfering substances. A more specific alternate chemical method must be used in order to obtain a  confirmed analytical result. Gas chromatography / mass spectrometry (GC/MS) is the preferred confirm atory method. Performed at [facility], [address] [place], [place] [address]   CBC     Status: Abnormal   Collection Time: 10/07/20  5:57 AM  Result Value Ref Range   WBC 11.4 (H) 4.0 - 10.5 K/uL   RBC 3.63 (L) 3.87 - 5.11 MIL/uL   Hemoglobin 11.1 (L) 12.0 - 15.0 g/dL   HCT 13.2 (L) 36 - 46 %   MCV 86.8 80.0 - 100.0 fL   MCH 30.6 26.0 - 34.0 pg   MCHC 35.2 30.0 - 36.0 g/dL   RDW 44.0 10.2 - 72.5 %   Platelets 191 150 - 400 K/uL   nRBC 0.0 0.0 - 0.2 %    Comment: Performed at [facility], [address] [place], [place] [address]    Assessment:   19 y.o. G1P0 postpartum day # 1 lactating  Plan:    1) Acute blood loss anemia - hemodynamically stable and asymptomatic - po ferrous sulfate  2) Blood Type --/--/A POS Performed at [facility], [address] [place], [place] [address]  [phone]) / Rubella 10.50 (03/24 1434) / Varicella Immune  3) TDAP status up to date  4) Feeding plan breast  5)  Education given regarding options for contraception, as well as compatibility with breast feeding if applicable.  Patient plans on undecided for contraception.  6) Disposition: continue current care   Tresea Mall,  CNM [facility] 10/07/2020, 11:57 AM

## 2020-10-08 NOTE — Progress Notes (Signed)
Pt discharged with infant.  Discharge instructions, prescriptions and follow up appointment given to and reviewed with pt. Pt verbalized understanding. Escorted out by staff. 

## 2020-10-13 NOTE — H&P (Signed)
Chelsea Andrade is a 19 y.o. female presenting forplanned  induction of labor at 37 weeks 3 days due to Diagnosis of Intrauterine Growth Restriction.. OB History as of 10/08/2020    Gravida  1   Para      Term      Preterm      AB      Living        SAB      TAB      Ectopic      Multiple      Live Births             Past Medical History:  Diagnosis Date  . Anxiety   . Asthma   . Bipolar disorder (HCC)   . ODD (oppositional defiant disorder)    Past Surgical History:  Procedure Laterality Date  . TONSILLECTOMY     Family History: family history includes Anxiety disorder in her maternal grandmother and mother; Bipolar disorder in her father; Depression in her maternal grandmother; Multiple sclerosis in her maternal grandmother. Social History:  reports that she has been smoking. She has never used smokeless tobacco. She reports previous alcohol use. She reports previous drug use. Drug: Marijuana.     Maternal Diabetes: No Genetic Screening: Normal Maternal Ultrasounds/Referrals: IUGR identified during pregnancy Fetal Ultrasounds or other Referrals:  None Maternal Substance Abuse:  No Significant Maternal Medications:  None Significant Maternal Lab Results:  Group B Strep negative Other Comments:  None  Review of Systems  Constitutional: Negative.   HENT: Negative.   Eyes: Negative.   Respiratory: Negative.   Cardiovascular: Negative.   Gastrointestinal: Negative.   Genitourinary: Negative.   Musculoskeletal: Negative.   Allergic/Immunologic: Negative.   Neurological: Negative.   Hematological: Negative.   Psychiatric/Behavioral: Negative.   All other systems reviewed and are negative.  History Dilation: 10 Effacement (%): 100 Station: 0 Exam by:: D. Means RN Blood pressure 97/63, pulse 75, temperature 97.8 F (36.6 C), temperature source Oral, resp. rate 18, height 5\' 5"  (1.651 m), weight 63.5 kg, last menstrual period 01/17/2020, SpO2 100  %, unknown if currently breastfeeding. Maternal Exam:  Uterine Assessment: Contraction strength is mild.  Contraction frequency is irregular.   Abdomen: Patient reports no abdominal tenderness. Fetal presentation: vertex  Introitus: Normal vulva. Normal vagina.  Ferning test: not done.  Nitrazine test: not done.  Pelvis: adequate for delivery.   Cervix: Cervix evaluated by digital exam.   SVE: 1.5cms/thick/high. cervix is posterior.  Physical Exam Constitutional:      Appearance: Normal appearance.  HENT:     Head: Normocephalic.     Mouth/Throat:     Mouth: Mucous membranes are moist.     Pharynx: Oropharynx is clear.  Eyes:     Pupils: Pupils are equal, round, and reactive to light.  Cardiovascular:     Rate and Rhythm: Normal rate and regular rhythm.     Pulses: Normal pulses.     Heart sounds: Normal heart sounds.  Pulmonary:     Effort: Pulmonary effort is normal.     Breath sounds: Normal breath sounds.  Abdominal:     General: Bowel sounds are normal.     Palpations: Abdomen is soft.     Comments: gravid  Genitourinary:    General: Normal vulva.     Rectum: Normal.  Musculoskeletal:        General: Normal range of motion.     Cervical back: Normal range of motion and neck supple.  Skin:  General: Skin is warm and dry.  Neurological:     General: No focal deficit present.     Mental Status: She is alert and oriented to person, place, and time.  Psychiatric:        Mood and Affect: Mood normal.        Behavior: Behavior normal.     Prenatal labs: ABO, Rh: --/--/A POS Performed at [facility], [address] [place], [place] [address]  [phone]) Antibody: NEG (10/20 0528) Rubella: 10.50 (03/24 1434) RPR: NON REACTIVE (10/20 0528)  HBsAg: Negative (03/24 1434)  HIV: Non Reactive (08/20 1046)  GBS: Negative/-- (10/12 1146)   Assessment/Plan: IUP at  37 weeks 3 days gestation  Category 1 FHTS Planned induction of labor secondary to  IUGR Low Bishop score-       Will start with cervical ripening with Cytotec. Anticipate will progress to pitocin infusion once bishop score improves. Anticipate NSVD. Patient plans an epidural.  Mirna Mires, CNM  10/13/2020 11:41 AM     Mirna Mires 10/13/2020, 11:30 AM

## 2020-11-21 ENCOUNTER — Telehealth: Payer: Self-pay | Admitting: Obstetrics and Gynecology

## 2020-11-21 ENCOUNTER — Other Ambulatory Visit: Payer: Self-pay

## 2020-11-21 ENCOUNTER — Encounter: Payer: Self-pay | Admitting: Obstetrics and Gynecology

## 2020-11-21 ENCOUNTER — Ambulatory Visit (INDEPENDENT_AMBULATORY_CARE_PROVIDER_SITE_OTHER): Payer: Medicaid Other | Admitting: Obstetrics and Gynecology

## 2020-11-21 DIAGNOSIS — F53 Postpartum depression: Secondary | ICD-10-CM

## 2020-11-21 DIAGNOSIS — O99345 Other mental disorders complicating the puerperium: Secondary | ICD-10-CM

## 2020-11-21 MED ORDER — SERTRALINE HCL 50 MG PO TABS
50.0000 mg | ORAL_TABLET | Freq: Every day | ORAL | 2 refills | Status: DC
Start: 2020-11-21 — End: 2021-05-10

## 2020-11-21 NOTE — Progress Notes (Signed)
6 WK pp, Discuss OCP options, depression. RM 6

## 2020-11-21 NOTE — Telephone Encounter (Signed)
Patient coming in for Mirena insertion on 12/08/2020 at 2:10 with AMS in [place]

## 2020-11-21 NOTE — Progress Notes (Signed)
Postpartum Visit  Chief Complaint:  Chief Complaint  Patient presents with  . Post-op Follow-up    6 WK pp, Discuss OCP options, depression. RM 6    History of Present Illness: Patient is a 19 y.o. G1P0 presents for postpartum visit.  Date of delivery: 10/06/2020 Type of delivery: Vaginal delivery - Vacuum or forceps assisted  no Episiotomy No.  Laceration: yes 1st degree Pregnancy or labor problems:  no Any problems since the delivery:  no  Newborn Details:  SINGLETON :  1. BabyGener female. Birth weight:   Maternal Details:  Breast or formula feeding: plans to breastfeed Intercourse: No  Contraception after delivery: No  Any bowel or bladder issues: No  Post partum depression/anxiety noted:  Yes Edinburgh Post-Partum Depression Score: 14 Date of last PAP:N/A under 21  Review of Systems: Review of Systems  Constitutional: Negative.   Gastrointestinal: Negative.   Genitourinary: Negative.   Psychiatric/Behavioral: Positive for depression. Negative for hallucinations, memory loss, substance abuse and suicidal ideas. The patient is not nervous/anxious and does not have insomnia.     The following portions of the patient's history were reviewed and updated as appropriate: allergies, current medications, past family history, past medical history, past social history, past surgical history and problem list.  Past Medical History:  Past Medical History:  Diagnosis Date  . Anxiety   . Asthma   . Bipolar disorder (HCC)   . ODD (oppositional defiant disorder)     Past Surgical History:  Past Surgical History:  Procedure Laterality Date  . TONSILLECTOMY      Family History:  Family History  Problem Relation Age of Onset  . Anxiety disorder Mother   . Bipolar disorder Father   . Depression Maternal Grandmother   . Anxiety disorder Maternal Grandmother   . Multiple sclerosis Maternal Grandmother     Social History:  Social History   Socioeconomic History   . Marital status: Single    Spouse name: Not on file  . Number of children: Not on file  . Years of education: Not on file  . Highest education level: Not on file  Occupational History  . Not on file  Tobacco Use  . Smoking status: Current Every Day Smoker  . Smokeless tobacco: Never Used  Vaping Use  . Vaping Use: Some days  Substance and Sexual Activity  . Alcohol use: Not Currently  . Drug use: Not Currently    Types: Marijuana  . Sexual activity: Yes    Birth control/protection: None    Comment: pt denies being sexually active  Other Topics Concern  . Not on file  Social History Narrative   Lives with Maternal Gma [name] and Mother Editha Bridgeforth. Step Dad 2 siblings. Cats, dogs, horses. Home schooled. Uncle died in MVA last week. Hx of bipolar, depression, anxiety. Dad not involved.   Social Determinants of Health   Financial Resource Strain:   . Difficulty of Paying Living Expenses: Not on file  Food Insecurity:   . Worried About Programme researcher, broadcasting/film/video in the Last Year: Not on file  . Ran Out of Food in the Last Year: Not on file  Transportation Needs:   . Lack of Transportation (Medical): Not on file  . Lack of Transportation (Non-Medical): Not on file  Physical Activity:   . Days of Exercise per Week: Not on file  . Minutes of Exercise per Session: Not on file  Stress:   . Feeling of Stress :  Not on file  Social Connections:   . Frequency of Communication with Friends and Family: Not on file  . Frequency of Social Gatherings with Friends and Family: Not on file  . Attends Religious Services: Not on file  . Active Member of Clubs or Organizations: Not on file  . Attends Banker Meetings: Not on file  . Marital Status: Not on file  Intimate Partner Violence:   . Fear of Current or Ex-Partner: Not on file  . Emotionally Abused: Not on file  . Physically Abused: Not on file  . Sexually Abused: Not on file    Allergies:  No Known  Allergies  Medications: Prior to Admission medications   Medication Sig Start Date End Date Taking? Authorizing Provider  ferrous sulfate (FERROUSUL) 325 (65 FE) MG tablet Take 1 tablet (325 mg total) by mouth daily with breakfast. 08/08/20   Vena Austria, MD  Prenatal Vit-Fe Fumarate-FA (PRENATAL MULTIVITAMIN) TABS tablet Take 1 tablet by mouth daily at 12 noon.    [provider]    Physical Exam Blood pressure 100/60, height 5\' 5"  (1.651 m), weight 126 lb (57.2 kg), unknown if currently breastfeeding.    General: NAD HEENT: normocephalic, anicteric Pulmonary: No increased work of breathing Abdomen: NABS, soft, non-tender, non-distended.  Umbilicus without lesions.  No hepatomegaly, splenomegaly or masses palpable. No evidence of hernia. Genitourinary:  External: Normal external female genitalia.  Normal urethral meatus, normal  Bartholin's and Skene's glands.    Vagina: Normal vaginal mucosa, no evidence of prolapse.    Cervix: Grossly normal in appearance, no bleeding  Uterus: Non-enlarged, mobile, normal contour.  No CMT  Adnexa: ovaries non-enlarged, no adnexal masses  Rectal: deferred Extremities: no edema, erythema, or tenderness Neurologic: Grossly intact Psychiatric: mood appropriate, affect full   Edinburgh Postnatal Depression Scale - 11/21/20 1447      Edinburgh Postnatal Depression Scale:  In the Past 7 Days   I have been able to laugh and see the funny side of things. 1    I have looked forward with enjoyment to things. 2    I have blamed myself unnecessarily when things went wrong. 0    I have been anxious or worried for no good reason. 3    I have felt scared or panicky for no good reason. 0    Things have been getting on top of me. 3    I have been so unhappy that I have had difficulty sleeping. 2    I have felt sad or miserable. 2    I have been so unhappy that I have been crying. 1    The thought of harming myself has occurred to me. 0     Edinburgh Postnatal Depression Scale Total 14           Assessment: 19 y.o. G1P0 presenting for 6 week postpartum visit  Plan: Problem List Items Addressed This Visit    None       1) Contraception - Education given regarding options for contraception, as well as compatibility with breast feeding if applicable.  Patient plans on IUD for contraception.  2)  Pap - ASCCP guidelines and rational discussed.  ASCCP guidelines and rational discussed.  Start age 38  3) Patient underwent screening for postpartum depression. - Start Zoloft 50mg  po daily  4) Return in about 2 weeks (around 12/05/2020) for Medication follow up and Mirena IUD placement.   , MD, 12/07/2020 OB/GYN, [facility] 11/21/2020,  3:34 PM

## 2020-12-08 ENCOUNTER — Ambulatory Visit: Payer: Medicaid Other | Admitting: Obstetrics and Gynecology

## 2020-12-08 NOTE — Progress Notes (Deleted)
Obstetrics & Gynecology Office Visit   Chief Complaint: No chief complaint on file.   History of Present Illness: The patient is a 19 y.o. female presenting follow up for symptoms of postpartum depression.  The patient is currently taking Zoloft 50mg  for the management of her symptoms.  She has had recent situational stressors, namely child birth.  She reports symptoms of {Blank multiple:19196::"anhedonia","day time somnolence","insomnia","risk taking behavior","irritability","increased appetite","decreased appetite","social anxiety","agorophobia","feelings of guilt","feelings of worthlessness","suicidal ideation","homicidal ideation","auditory hallucinations","visual hallucinations"}.  She denies {Blank multiple:19196::"anhedonia","day time somnolence","insomnia","risk taking behavior","irritability","increased appetite","decreased appetite","social anxiety","agorophobia","feelings of guilt","feelings of worthlessness","suicidal ideation","homicidal ideation","auditory hallucinations","visual hallucinations"}. Symptoms have {Blank single:19197::"improved","remained unchanged","worsened"} since {Blank single:19197::""initial onset ***","last visit"}.     The patient {DOES_DOES have a pre-existing history of depression and anxiety.  She  {DOES_DOES OBS:96283} a prior history of suicide attempts.  Previous treatment tied include {Blank  multiple:19196::"Zoloft","Lexapro","Celexa","Prozac","Welbutrin","Buspirone","Benzodiazapines","CBT","Counseling","***"}  Review of Systems: ***  Past Medical History:  Past Medical History:  Diagnosis Date  . Anxiety   . Asthma   . Bipolar disorder (HCC)   . ODD (oppositional defiant disorder)     Past Surgical History:  Past Surgical History:  Procedure Laterality Date  . TONSILLECTOMY      Gynecologic History: No LMP recorded.  Obstetric History: G1P0  Family History:  Family History  Problem Relation Age of Onset  . Anxiety  disorder Mother   . Bipolar disorder Father   . Depression Maternal Grandmother   . Anxiety disorder Maternal Grandmother   . Multiple sclerosis Maternal Grandmother     Social History:  Social History   Socioeconomic History  . Marital status: Single    Spouse name: Not on file  . Number of children: Not on file  . Years of education: Not on file  . Highest education level: Not on file  Occupational History  . Not on file  Tobacco Use  . Smoking status: Current Every Day Smoker  . Smokeless tobacco: Never Used  Vaping Use  . Vaping Use: Some days  Substance and Sexual Activity  . Alcohol use: Not Currently  . Drug use: Not Currently    Types: Marijuana  . Sexual activity: Yes    Birth control/protection: None    Comment: pt denies being sexually active  Other Topics Concern  . Not on file  Social History Narrative   Lives with Maternal Gma MOQ:[id number]} and Mother Prisca Gearing. Step Dad 2 siblings. Cats, dogs, horses. Home schooled. Uncle died in MVA last week. Hx of bipolar, depression, anxiety. Dad not involved.   Social Determinants of Health   Financial Resource Strain: Not on file  Food Insecurity: Not on file  Transportation Needs: Not on file  Physical Activity: Not on file  Stress: Not on file  Social Connections: Not on file  Intimate Partner Violence: Not on file    Allergies:  No Known Allergies  Medications: Prior to Admission medications   Medication Sig Start Date End Date Taking? Authorizing Provider  ferrous sulfate (FERROUSUL) 325 (65 FE) MG tablet Take 1 tablet (325 mg total) by mouth daily with breakfast. 08/08/20   08/10/20, MD  Prenatal Vit-Fe Fumarate-FA (PRENATAL MULTIVITAMIN) TABS tablet Take 1 tablet by mouth daily at 12 noon.    [provider]  sertraline (ZOLOFT) 50 MG tablet Take 1 tablet (50 mg total) by mouth daily. 11/21/20   14/6/21, MD    Physical Exam Vitals: *** General: NAD HEENT:  normocephalic, anicteric Pulmonary: No increased work of breathing  Neurologic: Grossly intact Psychiatric: mood appropriate, affect full    No flowsheet data found.  No flowsheet data found.  No flowsheet data found.     GYNECOLOGY OFFICE PROCEDURE NOTE  Vanissa Portner is a 19 y.o. G1P0 here for ***a IUD insertion. No GYN concerns.  No pap per ASCCP guidelines, under 21.  The patient is currently using breast feeding for contraception and her LMP is No LMP recorded..  The indication for her IUD is contraception.  IUD Insertion Procedure Note Patient identified, informed consent performed, consent signed.   Discussed risks of irregular bleeding, cramping, infection, malpositioning, expulsion or uterine perforation of the IUD (1:1000 placements)  which may require further procedure such as laparoscopy.  IUD while effective at preventing pregnancy do not prevent transmission of sexually transmitted diseases and use of barrier methods for this purpose was discussed. Time out was performed.  Urine pregnancy test negative.  Speculum placed in the vagina.  Cervix visualized.  Cleaned with Betadine x 2.  Grasped anteriorly with a single tooth tenaculum.  Uterus sounded to *** cm. IUD placed per manufacturer's recommendations.  Strings trimmed to 3 cm. Tenaculum was removed, good hemostasis noted.  Patient tolerated procedure well.   Patient was given post-procedure instructions.  She was advised to have backup contraception for one week.  Patient was also asked to check IUD strings periodically and follow up in 6 weeks for IUD check.  IUD insertion CPT 58300,  Skyla J7301 Mirena J7298 Liletta J7297 Paraguard J7300 Rutha Bouchard [phone] Modifer 25, plus Modifer 79 is done during a global billing visit   Vena Austria, MD, [facility], [facility]     Assessment: 19 y.o. G1P0 follow up postpartum depression and Mirena IUD insertion  Plan: Problem List Items  Addressed This Visit   None   Visit Diagnoses    Postpartum depression    -  Primary   Encounter for IUD insertion          1) ***  2) Thyroid and B12 screen {HAS/HAS NOT:20194} been obtained previously   Vena Austria, MD, [facility], [facility] 12/08/2020, 10:30 AM

## 2021-01-17 ENCOUNTER — Ambulatory Visit: Payer: Medicaid Other | Admitting: Obstetrics and Gynecology

## 2021-02-21 ENCOUNTER — Emergency Department
Admission: EM | Admit: 2021-02-21 | Discharge: 2021-02-21 | Disposition: A | Discharge status: Home / Self Care | Payer: Medicaid Other | Attending: Emergency Medicine | Admitting: Emergency Medicine

## 2021-02-21 ENCOUNTER — Other Ambulatory Visit: Payer: Self-pay

## 2021-02-21 DIAGNOSIS — R1013 Epigastric pain: Secondary | ICD-10-CM | POA: Diagnosis present

## 2021-02-21 DIAGNOSIS — J45909 Unspecified asthma, uncomplicated: Secondary | ICD-10-CM | POA: Insufficient documentation

## 2021-02-21 DIAGNOSIS — F17200 Nicotine dependence, unspecified, uncomplicated: Secondary | ICD-10-CM | POA: Insufficient documentation

## 2021-02-21 DIAGNOSIS — R112 Nausea with vomiting, unspecified: Secondary | ICD-10-CM | POA: Diagnosis not present

## 2021-02-21 LAB — CBC WITH DIFFERENTIAL/PLATELET
Abs Immature Granulocytes: 0.04 10*3/uL (ref 0.00–0.07)
Basophils Absolute: 0 10*3/uL (ref 0.0–0.1)
Basophils Relative: 0 %
Eosinophils Absolute: 0 10*3/uL (ref 0.0–0.5)
Eosinophils Relative: 0 %
HCT: 40.1 % (ref 36.0–46.0)
Hemoglobin: 13.4 g/dL (ref 12.0–15.0)
Immature Granulocytes: 0 %
Lymphocytes Relative: 3 %
Lymphs Abs: 0.3 10*3/uL — ABNORMAL LOW (ref 0.7–4.0)
MCH: 29.3 pg (ref 26.0–34.0)
MCHC: 33.4 g/dL (ref 30.0–36.0)
MCV: 87.6 fL (ref 80.0–100.0)
Monocytes Absolute: 0.7 10*3/uL (ref 0.1–1.0)
Monocytes Relative: 5 %
Neutro Abs: 12.7 10*3/uL — ABNORMAL HIGH (ref 1.7–7.7)
Neutrophils Relative %: 92 %
Platelets: 270 10*3/uL (ref 150–400)
RBC: 4.58 MIL/uL (ref 3.87–5.11)
RDW: 11.9 % (ref 11.5–15.5)
WBC: 13.8 10*3/uL — ABNORMAL HIGH (ref 4.0–10.5)
nRBC: 0 % (ref 0.0–0.2)

## 2021-02-21 LAB — COMPREHENSIVE METABOLIC PANEL
ALT: 12 U/L (ref 0–44)
AST: 26 U/L (ref 15–41)
Albumin: 4.5 g/dL (ref 3.5–5.0)
Alkaline Phosphatase: 58 U/L (ref 38–126)
Anion gap: 10 (ref 5–15)
BUN: 16 mg/dL (ref 6–20)
CO2: 23 mmol/L (ref 22–32)
Calcium: 9 mg/dL (ref 8.9–10.3)
Chloride: 106 mmol/L (ref 98–111)
Creatinine, Ser: 0.62 mg/dL (ref 0.44–1.00)
GFR, Estimated: >60 mL/min (ref >60)
Glucose, Bld: 112 mg/dL — ABNORMAL HIGH (ref 70–99)
Potassium: 4.3 mmol/L (ref 3.5–5.1)
Sodium: 139 mmol/L (ref 135–145)
Total Bilirubin: 1.3 mg/dL — ABNORMAL HIGH (ref 0.3–1.2)
Total Protein: 7.2 g/dL (ref 6.5–8.1)

## 2021-02-21 LAB — LIPASE, BLOOD: Lipase: 24 U/L (ref 11–51)

## 2021-02-21 MED ORDER — PANTOPRAZOLE SODIUM 40 MG IV SOLR
40.0000 mg | Freq: Once | INTRAVENOUS | Status: AC
  Administered 2021-02-21: 40 mg via INTRAVENOUS
  Filled 2021-02-21: qty 40

## 2021-02-21 MED ORDER — FAMOTIDINE 20 MG PO TABS: 20.0000 mg | ORAL_TABLET | Freq: Two times a day (BID) | ORAL | 0 refills | Status: DC

## 2021-02-21 MED ORDER — ONDANSETRON 4 MG PO TBDP: 4.0000 mg | ORAL_TABLET | Freq: Three times a day (TID) | ORAL | 0 refills | Status: DC | PRN

## 2021-02-21 NOTE — ED Triage Notes (Signed)
Pt BIBA via [facility] c/o vomiting with abdominal pain radiating to back. Pt believes she ingested rotten hamburger meat for dinner last night. EMS report roommate is also sick. Pt received 4 mg IV zofran and 100 ccs of NS PTA.  Pt resting comfortably in stretcher and in NAD at this time.

## 2021-02-21 NOTE — ED Provider Notes (Signed)
[facility] Emergency Department Provider Note  ____________________________________________  Time seen: Approximately 5:45 AM  I have reviewed the triage vital signs and the nursing notes.   HISTORY  Chief Complaint Abdominal Pain and Emesis    HPI Chelsea Andrade is a 20 y.o. female with a history of anxiety who comes ED complaining of epigastric pain radiating to the back with nausea and vomiting that started after eating dinner which included some hamburger meat that seemed expired.  Roommate also got sick tonight after eating the same food.  No diarrhea.  No chest pain shortness of breath fevers or chills, no body aches.   No aggravating or alleviating factors.  Given 4 mg of Zofran by EMS.     Past Medical History:  Diagnosis Date  . Anxiety   . Asthma   . Bipolar disorder (HCC)   . ODD (oppositional defiant disorder)      Patient Active Problem List   Diagnosis Date Noted  . Single liveborn infant delivered vaginally 10/08/2020  . Encounter for care or examination of lactating mother 10/08/2020  . Normal labor and delivery 10/06/2020  . Postpartum care following vaginal delivery 10/06/2020  . IUGR (intrauterine growth restriction) affecting care of mother 10/05/2020  . Poor fetal growth affecting management of mother in third trimester 09/06/2020  . Abnormal glucose tolerance test 08/08/2020  . Anemia affecting pregnancy in third trimester 08/08/2020  . Poor weight gain of pregnancy, second trimester 07/08/2020  . Herpes simplex type 2 (HSV-2) infection affecting pregnancy, antepartum 03/09/2020  . Supervision of normal first pregnancy, antepartum 03/09/2020  . Generalized anxiety disorder 06/01/2015  . Attention deficit hyperactivity disorder (ADHD), combined type, moderate 06/01/2015  . 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 Drug overdose 05/30/2015  . Ingestion of unknown medication  05/30/2015  . Drug ingestion 05/30/2015  . ASTHMA 07/31/2011  . CONTUSION OF FOOT 07/31/2011     Past Surgical History:  Procedure Laterality Date  . TONSILLECTOMY       Prior to Admission medications   Medication Sig Start Date End Date Taking? Authorizing Provider  famotidine (PEPCID) 20 MG tablet Take 1 tablet (20 mg total) by mouth 2 (two) times daily. 02/21/21  Yes Sharman Cheek, MD  ondansetron (ZOFRAN ODT) 4 MG disintegrating tablet Take 1 tablet (4 mg total) by mouth every 8 (eight) hours as needed for nausea or vomiting. 02/21/21  Yes Sharman Cheek, MD  ferrous sulfate (FERROUSUL) 325 (65 FE) MG tablet Take 1 tablet (325 mg total) by mouth daily with breakfast. 08/08/20   Vena Austria, MD  Prenatal Vit-Fe Fumarate-FA (PRENATAL MULTIVITAMIN) TABS tablet Take 1 tablet by mouth daily at 12 noon.    [provider]  sertraline (ZOLOFT) 50 MG tablet Take 1 tablet (50 mg total) by mouth daily. 11/21/20   Vena Austria, MD     Allergies Patient has no known allergies.   Family History  Problem Relation Age of Onset  . Anxiety disorder Mother   . Bipolar disorder Father   . Depression Maternal Grandmother   . Anxiety disorder Maternal Grandmother   . Multiple sclerosis Maternal Grandmother     Social History Social History   Tobacco Use  . Smoking status: Current Every Day Smoker  . Smokeless tobacco: Never Used  Vaping Use  . Vaping Use: Some days  Substance Use Topics  . Alcohol use: Not Currently  . Drug use: Not Currently    Types: Marijuana  Review of Systems  Constitutional:   No fever or chills.  ENT:   No sore throat. No rhinorrhea. Cardiovascular:   No chest pain or syncope. Respiratory:   No dyspnea or cough. Gastrointestinal: Positive as above for abdominal pain and vomiting Musculoskeletal:   Negative for focal pain or swelling All other systems reviewed and are negative except as documented above in ROS and  HPI.  ____________________________________________   PHYSICAL EXAM:  VITAL SIGNS: ED Triage Vitals [02/21/21 0456]  Enc Vitals Group     BP 103/71     Pulse Rate 81     Resp 16     Temp 97.9 F (36.6 C)     Temp Source Oral     SpO2 98 %     Weight 130 lb (59 kg)     Height 5\' 4"  (1.626 m)     Head Circumference      Peak Flow      Pain Score 8     Pain Loc      Pain Edu?      Excl. in GC?     Vital signs reviewed, nursing assessments reviewed.   Constitutional:   Alert and oriented. Non-toxic appearance. Eyes:   Conjunctivae are normal. EOMI. PERRL. ENT      Head:   Normocephalic and atraumatic.      Nose:   Wearing a mask.      Mouth/Throat:   Wearing a mask.      Neck:   No meningismus. Full ROM. Hematological/Lymphatic/Immunilogical:   No cervical lymphadenopathy. Cardiovascular:   RRR. Symmetric bilateral radial and DP pulses.  No murmurs. Cap refill less than 2 seconds. Respiratory:   Normal respiratory effort without tachypnea/retractions. Breath sounds are clear and equal bilaterally. No wheezes/rales/rhonchi. Gastrointestinal:   Soft with mild epigastric pain. Non distended. There is no CVA tenderness.  No rebound, rigidity, or guarding.  Musculoskeletal:   Normal range of motion in all extremities. No joint effusions.  No lower extremity tenderness.  No edema. Neurologic:   Normal speech and language.  Motor grossly intact. No acute focal neurologic deficits are appreciated.  Skin:    Skin is warm, dry and intact. No rash noted.  No petechiae, purpura, or bullae.  ____________________________________________    LABS (pertinent positives/negatives) (all labs ordered are listed, but only abnormal results are displayed) Labs Reviewed  COMPREHENSIVE METABOLIC PANEL - Abnormal; Notable for the following components:      Result Value   Glucose, Bld 112 (*)    Total Bilirubin 1.3 (*)    All other components within normal limits  CBC WITH  DIFFERENTIAL/PLATELET - Abnormal; Notable for the following components:   WBC 13.8 (*)    Neutro Abs 12.7 (*)    Lymphs Abs 0.3 (*)    All other components within normal limits  LIPASE, BLOOD   ____________________________________________   EKG    ____________________________________________    RADIOLOGY  No results found.  ____________________________________________   PROCEDURES Procedures  ____________________________________________  DIFFERENTIAL DIAGNOSIS   Pancreatitis, biliary colic, foodborne illness, GERD  CLINICAL IMPRESSION / ASSESSMENT AND PLAN / ED COURSE  Medications ordered in the ED: Medications  pantoprazole (PROTONIX) injection 40 mg (40 mg Intravenous Given 02/21/21 0505)    Pertinent labs & imaging results that were available during my care of the patient were reviewed by me and considered in my medical decision making (see chart for details).  Chelsea Andrade was evaluated in Emergency Department on 02/21/2021 for the  symptoms described in the history of present illness. She was evaluated in the context of the global COVID-19 pandemic, which necessitated consideration that the patient might be at risk for infection with the SARS-CoV-2 virus that causes COVID-19. Institutional protocols and algorithms that pertain to the evaluation of patients at risk for COVID-19 are in a state of rapid change based on information released by regulatory bodies including the CDC and federal and state organizations. These policies and algorithms were followed during the patient's care in the ED.   Patient presents with epigastric pain and vomiting after possibly eating expired meat.  Vital signs are normal, she is nontoxic.  Patient given medication for symptom relief.  IV fluids for hydration.  Labs all normal.  She is requesting something to drink and tolerating oral intake on arrival.  Stable for discharge home, will treat supportively with Pepcid and Zofran.    Considering the patient's symptoms, medical history, and physical examination today, I have low suspicion for cholecystitis or biliary pathology, pancreatitis, perforation or bowel obstruction, hernia, intra-abdominal abscess, AAA or dissection, volvulus or intussusception, mesenteric ischemia, or appendicitis.  Doubt pregnancy, ectopic, STI PID TOA or torsion.      ____________________________________________   FINAL CLINICAL IMPRESSION(S) / ED DIAGNOSES    Final diagnoses:  Non-intractable vomiting with nausea, unspecified vomiting type  Epigastric pain     ED Discharge Orders         Ordered    famotidine (PEPCID) 20 MG tablet  2 times daily        02/21/21 0545    ondansetron (ZOFRAN ODT) 4 MG disintegrating tablet  Every 8 hours PRN        02/21/21 0545          Portions of this note were generated with dragon dictation software. Dictation errors may occur despite best attempts at proofreading.   Sharman Cheek, MD 02/21/21 [id number]

## 2021-05-10 ENCOUNTER — Other Ambulatory Visit: Payer: Self-pay

## 2021-05-10 ENCOUNTER — Ambulatory Visit (INDEPENDENT_AMBULATORY_CARE_PROVIDER_SITE_OTHER): Payer: Medicaid Other | Admitting: Obstetrics

## 2021-05-10 ENCOUNTER — Other Ambulatory Visit (HOSPITAL_COMMUNITY)
Admission: RE | Admit: 2021-05-10 | Discharge: 2021-05-10 | Disposition: A | Discharge status: Home / Self Care | Payer: Medicaid Other | Source: Ambulatory Visit | Attending: Obstetrics and Gynecology | Admitting: Obstetrics and Gynecology

## 2021-05-10 ENCOUNTER — Other Ambulatory Visit: Payer: Self-pay | Admitting: Obstetrics and Gynecology

## 2021-05-10 ENCOUNTER — Ambulatory Visit: Payer: Medicaid Other | Admitting: Obstetrics and Gynecology

## 2021-05-10 ENCOUNTER — Encounter: Payer: Self-pay | Admitting: Obstetrics

## 2021-05-10 VITALS — BP 120/80 | Ht 65.0 in | Wt 114.0 lb

## 2021-05-10 DIAGNOSIS — F411 Generalized anxiety disorder: Secondary | ICD-10-CM | POA: Diagnosis not present

## 2021-05-10 DIAGNOSIS — Z3009 Encounter for other general counseling and advice on contraception: Secondary | ICD-10-CM

## 2021-05-10 DIAGNOSIS — Z113 Encounter for screening for infections with a predominantly sexual mode of transmission: Secondary | ICD-10-CM | POA: Diagnosis present

## 2021-05-10 DIAGNOSIS — Z30011 Encounter for initial prescription of contraceptive pills: Secondary | ICD-10-CM

## 2021-05-10 MED ORDER — ESCITALOPRAM OXALATE 10 MG PO TABS: 10.0000 mg | ORAL_TABLET | Freq: Every day | ORAL | 0 refills | Status: DC

## 2021-05-10 MED ORDER — NORETHIN ACE-ETH ESTRAD-FE 1-20 MG-MCG PO TABS: 1.0000 | ORAL_TABLET | Freq: Every day | ORAL | 11 refills | Status: DC

## 2021-05-10 NOTE — Progress Notes (Signed)
Obstetrics & Gynecology Office Visit   Chief Complaint:  Chief Complaint  Patient presents with  . Contraception    Sti check    . Anxiety    Refill     History of Present Illness: Chelsea Andrade presents with several concerns and requests. She is requesting STI screening. She is also interested in using hormonal contraception, and is leaning towards OCPs. She has used an IUD and Nexplanon in the past, but was not happy with either method. She has a 76 month old baby at home. She is partnered, and single. She was started on Zoloft 50 mg after the birth of her baby, but did not follow up on this medication, and today shares that she would like to revisit the issue of mood "challenges". She feels that she has both anxiety symptoms and also some depressive signs that are related to her perinatal period. Her hx is noted for bipolar depression as well as anxiety. She ahs had limited in patient mental health treatment , but has not been recently under the care of a psychiatrist. She has opted to use daily exercise to improve her mood, and would like to trial an SSRI.    Review of Systems:  Review of Systems  Constitutional: Negative.   HENT: Negative.   Eyes: Negative.   Respiratory: Negative.   Cardiovascular: Negative.   Gastrointestinal: Negative.   Genitourinary: Negative.   Musculoskeletal: Negative.   Skin: Negative.   Neurological: Negative.   Endo/Heme/Allergies: Negative.   Psychiatric/Behavioral: The patient is nervous/anxious.        Documented hx of both depression, anxiety and bipolar disorder.     Past Medical History:  Past Medical History:  Diagnosis Date  . Anxiety   . Asthma   . Bipolar disorder (HCC)   . ODD (oppositional defiant disorder)     Past Surgical History:  Past Surgical History:  Procedure Laterality Date  . TONSILLECTOMY      Gynecologic History: Patient's last menstrual period was 04/13/2021.  Obstetric History: G1P0  Family History:   Family History  Problem Relation Age of Onset  . Anxiety disorder Mother   . Bipolar disorder Father   . Depression Maternal Grandmother   . Anxiety disorder Maternal Grandmother   . Multiple sclerosis Maternal Grandmother     Social History:  Social History   Socioeconomic History  . Marital status: Single    Spouse name: Not on file  . Number of children: Not on file  . Years of education: Not on file  . Highest education level: Not on file  Occupational History  . Not on file  Tobacco Use  . Smoking status: Current Every Day Smoker  . Smokeless tobacco: Never Used  Vaping Use  . Vaping Use: Some days  Substance and Sexual Activity  . Alcohol use: Not Currently  . Drug use: Not Currently    Types: Marijuana  . Sexual activity: Yes    Birth control/protection: None    Comment: pt denies being sexually active  Other Topics Concern  . Not on file  Social History Narrative   Lives with Maternal Gma [name] and Mother Corby Villasenor. Step Dad 2 siblings. Cats, dogs, horses. Home schooled. Uncle died in MVA last week. Hx of bipolar, depression, anxiety. Dad not involved.   Social Determinants of Health   Financial Resource Strain: Not on file  Food Insecurity: Not on file  Transportation Needs: Not on file  Physical Activity: Not on file  Stress: Not on file  Social Connections: Not on file  Intimate Partner Violence: Not on file    Allergies:  No Known Allergies  Medications: Prior to Admission medications   Medication Sig Start Date End Date Taking? Authorizing Provider  escitalopram (LEXAPRO) 10 MG tablet Take 1 tablet (10 mg total) by mouth daily. 05/10/21  Yes Mirna Mires, CNM  famotidine (PEPCID) 20 MG tablet Take 1 tablet (20 mg total) by mouth 2 (two) times daily. Patient not taking: Reported on 05/10/2021 02/21/21   Sharman Cheek, MD  ferrous sulfate (FERROUSUL) 325 (65 FE) MG tablet Take 1 tablet (325 mg total) by mouth daily with  breakfast. 08/08/20   Vena Austria, MD  ondansetron (ZOFRAN ODT) 4 MG disintegrating tablet Take 1 tablet (4 mg total) by mouth every 8 (eight) hours as needed for nausea or vomiting. Patient not taking: Reported on 05/10/2021 02/21/21   Sharman Cheek, MD  Prenatal Vit-Fe Fumarate-FA (PRENATAL MULTIVITAMIN) TABS tablet Take 1 tablet by mouth daily at 12 noon. Patient not taking: Reported on 05/10/2021    [provider]    Physical Exam Vitals:  Vitals:   05/10/21 1405  BP: 120/80   Patient's last menstrual period was 04/13/2021.  Physical Exam Constitutional:      Appearance: Normal appearance. She is normal weight.  HENT:     Head: Normocephalic.     Nose: Nose normal.  Cardiovascular:     Rate and Rhythm: Normal rate and regular rhythm.     Pulses: Normal pulses.     Heart sounds: Normal heart sounds.  Pulmonary:     Effort: Pulmonary effort is normal.     Breath sounds: Normal breath sounds.  Abdominal:     General: Abdomen is flat. Bowel sounds are normal.     Palpations: Abdomen is soft.  Genitourinary:    General: Normal vulva.     Comments: Uterus is non enlarged, anteverted, no adnexal tenderness or masses noted. Minimal amount of white discharge noted. No external lesions or rashes noted. Musculoskeletal:        General: Normal range of motion.     Cervical back: Normal range of motion and neck supple.  Skin:    General: Skin is warm and dry.  Neurological:     General: No focal deficit present.     Mental Status: She is alert and oriented to person, place, and time.  Psychiatric:        Mood and Affect: Mood normal.        Behavior: Behavior normal.   GAD-7 score 16 PHQ-9 score 9    Assessment: 20 y.o. G1P0 requesting contraception, treatment for anxiety/depressive symptoms, and STI screening. Generalized anxiety disorder New prescription for contraception   Plan: Problem List Items Addressed This Visit   None   Visit Diagnoses     Routine screening for STI (sexually transmitted infection)    -  Primary   Relevant Orders   HEP, RPR, HIV Panel   Cervicovaginal ancillary only   GAD (generalized anxiety disorder)       Relevant Medications   escitalopram (LEXAPRO) 10 MG tablet   Contraceptive education         RX for [employer] sent. Will contact her with the results for he STI screening tests. Discussed the use of SSRIs, the possible side effects, and options for medication. Will trial Lexapro, starting with 10 mg daily. She is instructed to continue on this medication and contact the provider for any  suicidal thoughts. We will revist the medication and the dosing at her next appointment in 3 months.

## 2021-05-11 LAB — HEP, RPR, HIV PANEL
HIV Screen 4th Generation wRfx: NONREACTIVE
Hepatitis B Surface Ag: NEGATIVE
RPR Ser Ql: NONREACTIVE

## 2021-05-12 LAB — CERVICOVAGINAL ANCILLARY ONLY
Bacterial Vaginitis (gardnerella): POSITIVE — AB
Candida Glabrata: NEGATIVE
Candida Vaginitis: NEGATIVE
Chlamydia: NEGATIVE
Comment: NEGATIVE
Comment: NEGATIVE
Comment: NEGATIVE
Comment: NEGATIVE
Comment: NEGATIVE
Comment: NORMAL
Neisseria Gonorrhea: NEGATIVE
Trichomonas: NEGATIVE

## 2021-05-13 ENCOUNTER — Other Ambulatory Visit: Payer: Self-pay | Admitting: Obstetrics

## 2021-05-13 MED ORDER — METRONIDAZOLE 0.75 % VA GEL: 1.0000 | Freq: Every day | VAGINAL | 1 refills | Status: AC

## 2021-05-13 NOTE — Progress Notes (Signed)
Nuswab shows + BV for this patient. RX for generic Metrogel is sent to her pharmacy and a VM is left on her cell notifying her of the same. Mirna Mires, CNM  05/13/2021 9:21 AM

## 2021-06-21 ENCOUNTER — Telehealth: Payer: Self-pay

## 2021-06-21 NOTE — Telephone Encounter (Signed)
Pt called after hour nurse 06/20/21 5:16 pm; stating she was rx'd medication for anxiety; needs a not to show her boss that she has to eat when she takes her medication.  [phone]

## 2021-06-21 NOTE — Telephone Encounter (Signed)
Left detailed msg.

## 2021-07-18 ENCOUNTER — Ambulatory Visit: Payer: Medicaid Other | Admitting: Obstetrics

## 2021-08-16 ENCOUNTER — Other Ambulatory Visit: Payer: Self-pay

## 2021-08-16 DIAGNOSIS — F411 Generalized anxiety disorder: Secondary | ICD-10-CM

## 2021-09-07 ENCOUNTER — Other Ambulatory Visit: Payer: Self-pay | Admitting: Obstetrics

## 2021-10-12 NOTE — Telephone Encounter (Signed)
NO SHOW 12/08/20 appointment

## 2022-02-14 ENCOUNTER — Other Ambulatory Visit: Payer: Self-pay

## 2022-11-07 ENCOUNTER — Other Ambulatory Visit: Payer: Self-pay

## 2022-11-07 DIAGNOSIS — Z30011 Encounter for initial prescription of contraceptive pills: Secondary | ICD-10-CM

## 2022-11-07 MED ORDER — NORETHIN ACE-ETH ESTRAD-FE 1-20 MG-MCG PO TABS: 1.0000 | ORAL_TABLET | Freq: Every day | ORAL | 1 refills | Status: DC

## 2022-11-29 ENCOUNTER — Other Ambulatory Visit: Payer: Self-pay | Admitting: Obstetrics and Gynecology

## 2022-11-29 DIAGNOSIS — Z30011 Encounter for initial prescription of contraceptive pills: Secondary | ICD-10-CM

## 2022-12-19 ENCOUNTER — Ambulatory Visit (INDEPENDENT_AMBULATORY_CARE_PROVIDER_SITE_OTHER): Payer: Medicaid Other | Admitting: Obstetrics and Gynecology

## 2022-12-19 ENCOUNTER — Encounter: Payer: Self-pay | Admitting: Obstetrics and Gynecology

## 2022-12-19 VITALS — BP 106/71 | HR 93 | Ht 65.0 in | Wt 119.7 lb

## 2022-12-19 DIAGNOSIS — Z3201 Encounter for pregnancy test, result positive: Secondary | ICD-10-CM | POA: Diagnosis not present

## 2022-12-19 DIAGNOSIS — Z01419 Encounter for gynecological examination (general) (routine) without abnormal findings: Secondary | ICD-10-CM

## 2022-12-19 DIAGNOSIS — Z113 Encounter for screening for infections with a predominantly sexual mode of transmission: Secondary | ICD-10-CM

## 2022-12-19 DIAGNOSIS — Z3200 Encounter for pregnancy test, result unknown: Secondary | ICD-10-CM

## 2022-12-19 LAB — POCT URINE PREGNANCY: Preg Test, Ur: POSITIVE — AB

## 2022-12-19 NOTE — Progress Notes (Signed)
HPI:      [name] is a 22 y.o. G1P0 who LMP was Patient's last menstrual period was 11/14/2022 (exact date).  Subjective:   She presents today for her annual examination.  She had unprotected intercourse after not being able to refill her OCPs.  She has significant breast tenderness. Home positive pregnancy test. First pregnancy was complicated by IUGR and induction at 37-1/2 weeks.  Baby weighed 5 pounds.    Hx: The following portions of the patient's history were reviewed and updated as appropriate:             She  has a past medical history of Anxiety, Asthma, Bipolar disorder ([place]), and ODD (oppositional defiant disorder). She does not have any pertinent problems on file. She  has a past surgical history that includes Tonsillectomy. Her family history includes Anxiety disorder in her maternal grandmother and mother; Bipolar disorder in her father; Depression in her maternal grandmother; Multiple sclerosis in her maternal grandmother. She  reports that she has been smoking e-cigarettes. She has never used smokeless tobacco. She reports that she does not currently use alcohol. She reports that she does not currently use drugs after having used the following drugs: Marijuana. She has a current medication list which includes the following prescription(s): lamotrigine and junel fe 1/20. She has No Known Allergies.       Review of Systems:  Review of Systems  Constitutional: Denied constitutional symptoms, night sweats, recent illness, fatigue, fever, insomnia and weight loss.  Eyes: Denied eye symptoms, eye pain, photophobia, vision change and visual disturbance.  Ears/Nose/Throat/Neck: Denied ear, nose, throat or neck symptoms, hearing loss, nasal discharge, sinus congestion and sore throat.  Cardiovascular: Denied cardiovascular symptoms, arrhythmia, chest pain/pressure, edema, exercise intolerance, orthopnea and palpitations.  Respiratory: Denied pulmonary symptoms, asthma,  pleuritic pain, productive sputum, cough, dyspnea and wheezing.  Gastrointestinal: Denied, gastro-esophageal reflux, melena, nausea and vomiting.  Genitourinary: Denied genitourinary symptoms including symptomatic vaginal discharge, pelvic relaxation issues, and urinary complaints.  Musculoskeletal: Denied musculoskeletal symptoms, stiffness, swelling, muscle weakness and myalgia.  Dermatologic: Denied dermatology symptoms, rash and scar.  Neurologic: Denied neurology symptoms, dizziness, headache, neck pain and syncope.  Psychiatric: Denied psychiatric symptoms, anxiety and depression.  Endocrine: Denied endocrine symptoms including hot flashes and night sweats.   Meds:   Current Outpatient Medications on File Prior to Visit  Medication Sig Dispense Refill   lamoTRIgine (LAMICTAL) 25 MG tablet Take 50 mg by mouth daily.     norethindrone-ethinyl estradiol-FE (JUNEL FE 1/20) 1-20 MG-MCG tablet TAKE 1 TABLET BY MOUTH EVERY DAY (Patient not taking: Reported on 12/19/2022) 28 tablet 0   No current facility-administered medications on file prior to visit.     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 Possible pregnancy, not  yet confirmed     Very early first trimester pregnancy-unplanned   Plan:            1.  Begin prenatal care.  2.  Ultrasound for  dates  3.  Begin prenatal vitamins  4.  Schedule nurse visit Orders Orders Placed This Encounter  Procedures   US OB Comp Less 14 Wks   POCT urine pregnancy    No orders of the defined types were placed in this encounter.         F/U  Return in about 4 weeks (around 01/16/2023). I spent 23 minutes involved in the care of this patient preparing to see the patient by obtaining and reviewing her medical history (including labs, imaging tests and prior procedures), documenting clinical information in the electronic health record (EHR), counseling and coordinating care plans, writing and sending prescriptions, ordering tests or procedures and in direct communicating with the patient and medical staff discussing pertinent items from her history and physical exam.  Finis Bud, M.D. 12/19/2022 10:59 AM

## 2022-12-19 NOTE — Progress Notes (Signed)
Patients presents for pregnancy confirmation.  She states missing a menstrual cycle and taking two home pregnancy tests that resulted in positive, in office UPT is positive.  Patient states no other questions or concerns at this time.

## 2023-01-16 ENCOUNTER — Ambulatory Visit: Payer: Medicaid Other

## 2023-01-16 NOTE — Progress Notes (Deleted)
Called pt to do the NOB Intake; pt states she started bleeding Monday night heavily and passing clots one of which was very large that she had to push out.  Is out of town for work and won't be home until the 5th.  Adv will send schedulers msg to call her and get her scheduled to see a provider before continuing with intake.  Will leave other NOB appts as is for now.  Pt agreeable to this plan.  Pt is still bleeding here and there and her cramping is less than her usual.

## 2023-01-16 NOTE — Telephone Encounter (Signed)
This encounter was created in error - please disregard.

## 2023-01-24 ENCOUNTER — Ambulatory Visit: Payer: Medicaid Other

## 2023-01-31 ENCOUNTER — Other Ambulatory Visit: Payer: Medicaid Other

## 2023-02-13 ENCOUNTER — Ambulatory Visit: Payer: Medicaid Other | Admitting: Obstetrics and Gynecology

## 2023-02-13 DIAGNOSIS — O200 Threatened abortion: Secondary | ICD-10-CM

## 2023-02-19 ENCOUNTER — Ambulatory Visit: Payer: Medicaid Other

## 2023-02-27 ENCOUNTER — Encounter: Payer: Medicaid Other | Admitting: Obstetrics and Gynecology

## 2023-04-17 NOTE — Telephone Encounter (Signed)
Error
